# Patient Record
Sex: Male | Born: 1935 | Race: White | Hispanic: No | Marital: Married | Smoking: Former smoker
Health system: Southern US, Community
[De-identification: ages and names within clinical notes are randomized; demographics above are authoritative.]

## PROBLEM LIST (undated history)

## (undated) DIAGNOSIS — E785 Hyperlipidemia, unspecified: Secondary | ICD-10-CM

## (undated) DIAGNOSIS — R51 Headache: Secondary | ICD-10-CM

## (undated) DIAGNOSIS — C4490 Unspecified malignant neoplasm of skin, unspecified: Secondary | ICD-10-CM

## (undated) DIAGNOSIS — M7551 Bursitis of right shoulder: Secondary | ICD-10-CM

## (undated) DIAGNOSIS — Z9861 Coronary angioplasty status: Secondary | ICD-10-CM

## (undated) DIAGNOSIS — N200 Calculus of kidney: Secondary | ICD-10-CM

## (undated) DIAGNOSIS — R0609 Other forms of dyspnea: Secondary | ICD-10-CM

## (undated) DIAGNOSIS — M1990 Unspecified osteoarthritis, unspecified site: Secondary | ICD-10-CM

## (undated) DIAGNOSIS — I2510 Atherosclerotic heart disease of native coronary artery without angina pectoris: Secondary | ICD-10-CM

## (undated) DIAGNOSIS — N400 Enlarged prostate without lower urinary tract symptoms: Secondary | ICD-10-CM

## (undated) DIAGNOSIS — I152 Hypertension secondary to endocrine disorders: Secondary | ICD-10-CM

## (undated) DIAGNOSIS — R0600 Dyspnea, unspecified: Secondary | ICD-10-CM

## (undated) DIAGNOSIS — E1159 Type 2 diabetes mellitus with other circulatory complications: Secondary | ICD-10-CM

## (undated) DIAGNOSIS — K219 Gastro-esophageal reflux disease without esophagitis: Secondary | ICD-10-CM

## (undated) DIAGNOSIS — M109 Gout, unspecified: Secondary | ICD-10-CM

## (undated) DIAGNOSIS — I480 Paroxysmal atrial fibrillation: Secondary | ICD-10-CM

## (undated) DIAGNOSIS — I209 Angina pectoris, unspecified: Secondary | ICD-10-CM

## (undated) DIAGNOSIS — I10 Essential (primary) hypertension: Secondary | ICD-10-CM

## (undated) HISTORY — PX: INGUINAL HERNIA REPAIR: SUR1180

## (undated) HISTORY — PX: CORONARY ANGIOPLASTY WITH STENT PLACEMENT: SHX49

## (undated) HISTORY — PX: SKIN CANCER EXCISION: SHX779

---

## 1957-06-02 HISTORY — PX: APPENDECTOMY: SHX54

## 1979-02-01 HISTORY — PX: CHOLECYSTECTOMY: SHX55

## 1994-06-02 DIAGNOSIS — I2510 Atherosclerotic heart disease of native coronary artery without angina pectoris: Secondary | ICD-10-CM

## 1994-06-02 HISTORY — DX: Atherosclerotic heart disease of native coronary artery without angina pectoris: I25.10

## 1994-06-02 HISTORY — PX: CORONARY ARTERY BYPASS GRAFT: SHX141

## 1998-09-25 ENCOUNTER — Encounter: Admission: RE | Admit: 1998-09-25 | Discharge: 1998-12-17 | Payer: Self-pay | Admitting: Anesthesiology

## 1998-10-08 ENCOUNTER — Encounter: Admission: RE | Admit: 1998-10-08 | Discharge: 1999-01-06 | Payer: Self-pay | Admitting: Family Medicine

## 2000-04-10 ENCOUNTER — Encounter: Admission: RE | Admit: 2000-04-10 | Discharge: 2000-04-10 | Payer: Self-pay | Admitting: Cardiothoracic Surgery

## 2000-04-10 ENCOUNTER — Encounter: Payer: Self-pay | Admitting: Cardiothoracic Surgery

## 2001-03-26 ENCOUNTER — Encounter: Payer: Self-pay | Admitting: Cardiothoracic Surgery

## 2001-03-26 ENCOUNTER — Encounter: Admission: RE | Admit: 2001-03-26 | Discharge: 2001-03-26 | Payer: Self-pay | Admitting: Cardiothoracic Surgery

## 2001-05-05 ENCOUNTER — Ambulatory Visit (HOSPITAL_COMMUNITY): Admission: RE | Admit: 2001-05-05 | Discharge: 2001-05-05 | Payer: Self-pay | Admitting: Gastroenterology

## 2001-10-08 ENCOUNTER — Encounter: Payer: Self-pay | Admitting: Cardiothoracic Surgery

## 2001-10-08 ENCOUNTER — Encounter: Admission: RE | Admit: 2001-10-08 | Discharge: 2001-10-08 | Payer: Self-pay | Admitting: Cardiothoracic Surgery

## 2001-11-23 ENCOUNTER — Encounter: Payer: Self-pay | Admitting: Urology

## 2001-11-23 ENCOUNTER — Encounter: Admission: RE | Admit: 2001-11-23 | Discharge: 2001-11-23 | Payer: Self-pay | Admitting: Urology

## 2002-04-05 ENCOUNTER — Encounter: Admission: RE | Admit: 2002-04-05 | Discharge: 2002-05-12 | Payer: Self-pay | Admitting: Orthopedic Surgery

## 2002-04-15 ENCOUNTER — Encounter: Admission: RE | Admit: 2002-04-15 | Discharge: 2002-04-15 | Payer: Self-pay | Admitting: Cardiothoracic Surgery

## 2002-04-15 ENCOUNTER — Encounter: Payer: Self-pay | Admitting: Cardiothoracic Surgery

## 2002-09-15 ENCOUNTER — Ambulatory Visit (HOSPITAL_BASED_OUTPATIENT_CLINIC_OR_DEPARTMENT_OTHER): Admission: RE | Admit: 2002-09-15 | Discharge: 2002-09-15 | Payer: Self-pay | Admitting: Urology

## 2002-09-15 ENCOUNTER — Encounter: Payer: Self-pay | Admitting: Urology

## 2002-10-28 ENCOUNTER — Encounter: Payer: Self-pay | Admitting: Cardiothoracic Surgery

## 2002-10-28 ENCOUNTER — Encounter: Admission: RE | Admit: 2002-10-28 | Discharge: 2002-10-28 | Payer: Self-pay | Admitting: Cardiothoracic Surgery

## 2004-04-10 ENCOUNTER — Ambulatory Visit (HOSPITAL_COMMUNITY): Admission: RE | Admit: 2004-04-10 | Discharge: 2004-04-10 | Payer: Self-pay | Admitting: Orthopedic Surgery

## 2004-06-14 ENCOUNTER — Ambulatory Visit (HOSPITAL_COMMUNITY): Admission: RE | Admit: 2004-06-14 | Discharge: 2004-06-14 | Payer: Self-pay | Admitting: Orthopedic Surgery

## 2004-06-24 ENCOUNTER — Encounter: Admission: RE | Admit: 2004-06-24 | Discharge: 2004-07-11 | Payer: Self-pay | Admitting: Orthopedic Surgery

## 2004-07-01 ENCOUNTER — Encounter: Admission: RE | Admit: 2004-07-01 | Discharge: 2004-07-01 | Payer: Self-pay | Admitting: Otolaryngology

## 2005-05-22 ENCOUNTER — Ambulatory Visit (HOSPITAL_COMMUNITY): Admission: RE | Admit: 2005-05-22 | Discharge: 2005-05-22 | Payer: Self-pay | Admitting: Internal Medicine

## 2007-08-15 ENCOUNTER — Ambulatory Visit: Payer: Self-pay | Admitting: Internal Medicine

## 2007-08-15 ENCOUNTER — Inpatient Hospital Stay (HOSPITAL_COMMUNITY): Admission: EM | Admit: 2007-08-15 | Discharge: 2007-08-17 | Payer: Self-pay | Admitting: Emergency Medicine

## 2007-08-16 ENCOUNTER — Encounter (INDEPENDENT_AMBULATORY_CARE_PROVIDER_SITE_OTHER): Payer: Self-pay | Admitting: Internal Medicine

## 2008-10-30 ENCOUNTER — Inpatient Hospital Stay (HOSPITAL_COMMUNITY): Admission: EM | Admit: 2008-10-30 | Discharge: 2008-11-03 | Payer: Self-pay | Admitting: Emergency Medicine

## 2008-10-31 HISTORY — PX: CORONARY STENT INTERVENTION: CATH118234

## 2008-11-01 DIAGNOSIS — I2510 Atherosclerotic heart disease of native coronary artery without angina pectoris: Secondary | ICD-10-CM

## 2008-11-20 ENCOUNTER — Encounter: Admission: RE | Admit: 2008-11-20 | Discharge: 2008-11-20 | Payer: Self-pay | Admitting: Family Medicine

## 2010-03-04 ENCOUNTER — Encounter (HOSPITAL_COMMUNITY)
Admission: RE | Admit: 2010-03-04 | Discharge: 2010-06-02 | Payer: Self-pay | Source: Home / Self Care | Attending: Internal Medicine | Admitting: Internal Medicine

## 2010-06-03 ENCOUNTER — Encounter (HOSPITAL_COMMUNITY)
Admission: RE | Admit: 2010-06-03 | Discharge: 2010-07-02 | Payer: Self-pay | Source: Home / Self Care | Attending: Internal Medicine | Admitting: Internal Medicine

## 2010-06-17 LAB — GLUCOSE, CAPILLARY: Glucose-Capillary: 100 mg/dL — ABNORMAL HIGH (ref 70–99)

## 2010-08-12 LAB — GLUCOSE, CAPILLARY: Glucose-Capillary: 81 mg/dL (ref 70–99)

## 2010-09-09 LAB — GLUCOSE, CAPILLARY
Glucose-Capillary: 115 mg/dL — ABNORMAL HIGH (ref 70–99)
Glucose-Capillary: 122 mg/dL — ABNORMAL HIGH (ref 70–99)
Glucose-Capillary: 124 mg/dL — ABNORMAL HIGH (ref 70–99)
Glucose-Capillary: 128 mg/dL — ABNORMAL HIGH (ref 70–99)
Glucose-Capillary: 139 mg/dL — ABNORMAL HIGH (ref 70–99)
Glucose-Capillary: 153 mg/dL — ABNORMAL HIGH (ref 70–99)
Glucose-Capillary: 236 mg/dL — ABNORMAL HIGH (ref 70–99)
Glucose-Capillary: 99 mg/dL (ref 70–99)

## 2010-09-09 LAB — FOLATE: Folate: >20 ng/mL

## 2010-09-09 LAB — HEPARIN LEVEL (UNFRACTIONATED)
Heparin Unfractionated: 0.46 IU/mL (ref 0.30–0.70)
Heparin Unfractionated: 0.76 IU/mL — ABNORMAL HIGH (ref 0.30–0.70)
Heparin Unfractionated: 0.89 IU/mL — ABNORMAL HIGH (ref 0.30–0.70)

## 2010-09-09 LAB — VITAMIN B12: Vitamin B-12: >2000 pg/mL — ABNORMAL HIGH (ref 211–911)

## 2010-09-09 LAB — BASIC METABOLIC PANEL
BUN: 15 mg/dL (ref 6–23)
BUN: 20 mg/dL (ref 6–23)
CO2: 23 mEq/L (ref 19–32)
CO2: 23 mEq/L (ref 19–32)
Calcium: 9.1 mg/dL (ref 8.4–10.5)
Calcium: 9.2 mg/dL (ref 8.4–10.5)
Chloride: 107 mEq/L (ref 96–112)
Creatinine, Ser: 1.31 mg/dL (ref 0.4–1.5)
GFR calc Af Amer: >60 mL/min (ref >60)
Glucose, Bld: 131 mg/dL — ABNORMAL HIGH (ref 70–99)
Glucose, Bld: 135 mg/dL — ABNORMAL HIGH (ref 70–99)
Potassium: 4.1 mEq/L (ref 3.5–5.1)
Sodium: 139 mEq/L (ref 135–145)

## 2010-09-09 LAB — CARDIAC PANEL(CRET KIN+CKTOT+MB+TROPI)
CK, MB: 12.3 ng/mL — ABNORMAL HIGH (ref 0.3–4.0)
CK, MB: 17.5 ng/mL — ABNORMAL HIGH (ref 0.3–4.0)
CK, MB: 32.8 ng/mL — ABNORMAL HIGH (ref 0.3–4.0)
Relative Index: 3.9 — ABNORMAL HIGH (ref 0.0–2.5)
Relative Index: 8.1 — ABNORMAL HIGH (ref 0.0–2.5)
Total CK: 286 U/L — ABNORMAL HIGH (ref 7–232)
Troponin I: 2.45 ng/mL (ref 0.00–0.06)
Troponin I: 5.37 ng/mL (ref 0.00–0.06)

## 2010-09-09 LAB — LIPID PANEL
Cholesterol: 110 mg/dL (ref 0–200)
HDL: 43 mg/dL (ref >39)
LDL Cholesterol: 58 mg/dL (ref 0–99)
Total CHOL/HDL Ratio: 2.6 RATIO
Triglycerides: 43 mg/dL (ref <150)
VLDL: 9 mg/dL (ref 0–40)

## 2010-09-09 LAB — CBC
HCT: 36.2 % — ABNORMAL LOW (ref 39.0–52.0)
HCT: 39.1 % (ref 39.0–52.0)
Hemoglobin: 12.3 g/dL — ABNORMAL LOW (ref 13.0–17.0)
Hemoglobin: 13.1 g/dL (ref 13.0–17.0)
MCHC: 33.6 g/dL (ref 30.0–36.0)
MCHC: 33.7 g/dL (ref 30.0–36.0)
MCV: 96.8 fL (ref 78.0–100.0)
MCV: 97.1 fL (ref 78.0–100.0)
Platelets: 182 10*3/uL (ref 150–400)
RBC: 3.73 MIL/uL — ABNORMAL LOW (ref 4.22–5.81)
RBC: 3.76 MIL/uL — ABNORMAL LOW (ref 4.22–5.81)
RBC: 4.04 MIL/uL — ABNORMAL LOW (ref 4.22–5.81)
RDW: 14.4 % (ref 11.5–15.5)
RDW: 14.5 % (ref 11.5–15.5)
WBC: 5.7 10*3/uL (ref 4.0–10.5)

## 2010-09-09 LAB — IRON AND TIBC
Iron: 89 ug/dL (ref 42–135)
Saturation Ratios: 27 % (ref 20–55)
TIBC: 331 ug/dL (ref 215–435)
UIBC: 242 ug/dL

## 2010-09-09 LAB — TROPONIN I: Troponin I: 0.02 ng/mL (ref 0.00–0.06)

## 2010-09-09 LAB — RETICULOCYTES
RBC.: 4.05 MIL/uL — ABNORMAL LOW (ref 4.22–5.81)
Retic Count, Absolute: 48.6 10*3/uL (ref 19.0–186.0)
Retic Ct Pct: 1.2 % (ref 0.4–3.1)

## 2010-09-09 LAB — TSH: TSH: 0.997 u[IU]/mL (ref 0.350–4.500)

## 2010-09-09 LAB — HEMOGLOBIN A1C
Hgb A1c MFr Bld: 6 % (ref 4.6–6.1)
Mean Plasma Glucose: 126 mg/dL

## 2010-09-09 LAB — FERRITIN: Ferritin: 78 ng/mL (ref 22–322)

## 2010-09-09 LAB — CK TOTAL AND CKMB (NOT AT ARMC)
CK, MB: 1.2 ng/mL (ref 0.3–4.0)
Relative Index: 0.8 (ref 0.0–2.5)
Total CK: 158 U/L (ref 7–232)

## 2010-09-10 LAB — URINALYSIS, ROUTINE W REFLEX MICROSCOPIC
Nitrite: NEGATIVE
Specific Gravity, Urine: 1.012 (ref 1.005–1.030)
Urobilinogen, UA: 0.2 mg/dL (ref 0.0–1.0)

## 2010-09-10 LAB — DIFFERENTIAL
Basophils Absolute: 0 10*3/uL (ref 0.0–0.1)
Lymphocytes Relative: 24 % (ref 12–46)
Monocytes Absolute: 0.5 10*3/uL (ref 0.1–1.0)
Neutro Abs: 3.9 10*3/uL (ref 1.7–7.7)

## 2010-09-10 LAB — CBC
Hemoglobin: 12.9 g/dL — ABNORMAL LOW (ref 13.0–17.0)
RBC: 3.95 MIL/uL — ABNORMAL LOW (ref 4.22–5.81)
RDW: 14 % (ref 11.5–15.5)
WBC: 6.1 10*3/uL (ref 4.0–10.5)

## 2010-09-10 LAB — SODIUM, URINE, RANDOM: Sodium, Ur: 107 mEq/L

## 2010-09-10 LAB — GLUCOSE, CAPILLARY: Glucose-Capillary: 103 mg/dL — ABNORMAL HIGH (ref 70–99)

## 2010-09-10 LAB — BASIC METABOLIC PANEL
Calcium: 9.3 mg/dL (ref 8.4–10.5)
GFR calc Af Amer: 53 mL/min — ABNORMAL LOW (ref >60)
GFR calc non Af Amer: 44 mL/min — ABNORMAL LOW (ref >60)
Glucose, Bld: 126 mg/dL — ABNORMAL HIGH (ref 70–99)
Sodium: 138 mEq/L (ref 135–145)

## 2010-09-10 LAB — APTT: aPTT: 29 seconds (ref 24–37)

## 2010-09-10 LAB — PROTIME-INR: INR: 1.1 (ref 0.00–1.49)

## 2010-09-10 LAB — CREATININE, URINE, RANDOM: Creatinine, Urine: 61.9 mg/dL

## 2010-09-10 LAB — CK TOTAL AND CKMB (NOT AT ARMC): CK, MB: 1.5 ng/mL (ref 0.3–4.0)

## 2010-09-10 LAB — POCT CARDIAC MARKERS
CKMB, poc: <1 ng/mL — ABNORMAL LOW (ref 1.0–8.0)
Myoglobin, poc: 121 ng/mL (ref 12–200)
Myoglobin, poc: 142 ng/mL (ref 12–200)

## 2010-10-15 NOTE — Discharge Summary (Signed)
NAME:  Dale Haynes, Dale Haynes         ACCOUNT NO.:  [id number]   MEDICAL RECORD NO.:  [id number]          PATIENT TYPE:  INP   LOCATION:  3741                         FACILITY:  [facility]   PHYSICIAN:  Thereasa Solo. Little, M.D. DATE OF BIRTH:  [birth year]   DATE OF ADMISSION:  10/30/2008  DATE OF DISCHARGE:  11/03/2008                               DISCHARGE SUMMARY   DISCHARGE DIAGNOSES:  1. Unstable angina.  2. Progressive coronary artery disease status post cardiac      catheterization with intervention.  He had a non-drug-eluting stent      driver to his saphenous vein graft to right coronary artery to a      proximal stenosis 80%.  He had some distal thrombosis in his      posterior descending artery.  He was given intracoronary verapamil      and nitroglycerin with resolution in less than 5 minutes.  He had a      brisk TIMI III flow after procedure.  3. Postprocedure non-ST elevation myocardial infarction.  4. History of coronary artery bypass grafting in 1996 by Dr. Kathlee Nations      Trigt.  He had a left internal mammary artery (graft) to his left      anterior descending and saphenous vein graft to his right coronary      artery, saphenous vein graft to his diagonal, and saphenous vein      graft to his obtuse marginal.  5. Renal insufficiency with Cozaar and hydrochlorothiazide.  Celebrex      held on admission and prior to his catheterization.  6. Non-insulin-dependent diabetes mellitus.  7. Dyslipidemia.  8. Hypertension.  9. Remote paroxysmal atrial fibrillation.   LABORATORY DATA:  On November 02, 2008 sodium 139, potassium 4.1, chloride  106, CO2 23, glucose 135, BUN 15, creatinine 1.22, hemoglobin 12.3,  hematocrit 36.2, WBC 6.4 and platelets 158.  Hemoglobin A1c was 6.0, TSH  was 0.997.  Total cholesterol was 110, triglycerides 43, HDL 43 and LDL  58.  Magnesium was 1.8.  On admission, point of care marker x2 is  negative.  CK-MB  1. 151/1.5, troponin of 0.01.  2. 158/1.2,  troponin of 0.02.  3. 155/1.4 troponin of 0.02.   Post procedure  1. CPK-MB 286/17.5, troponin of 2.45.  2. 403/32, troponin of 5.26.  3. 371/18.1 troponin of 5.37.  4. 315/12.3 troponin of 6.05.   X-ray Oct 30, 2008 showed no active process was evident.   DISCHARGE MEDICATIONS:  1. Actos 45 mg daily.  2. Allopurinol 200 mg a day.  3. Aspirin 81 mg a day.  4. Cozaar 25 mg a day.  5. Lipitor 10 mg a day.  6. Nexium 40 mg everyday.  7. Niaspan 2000 mg a day.  8. Proscar 5 mg a day.  9. Zetia 10 mg a day.  10.Januvia 5 mg everyday.  11.B12 500 mg a day.  12.Multivitamin everyday.  13.Lanoxin 0.25 mg a day.  14.Glucotrol half of a 2.5 mg everyday.  15.Vitamin D 4000 units everyday.  16.He is to start carvedilol 3.125 mg twice per day.  17.He is to start  his Plavix 75 mg a day for 90 days.  18.Nitroglycerin 1/150 under tongue every 5 minutes x3 when needed for      chest pain.   HOSPITAL COURSE:  [name] is a 75 year old male patient of Dr.  Julieanne Manson.  His primary care doctor is Dr. Charlesetta Shanks.  He came  into the hospital with left-sided numbness, left jaw pain relieved with  nitroglycerin, no exertional symptoms and no shortness of breath.  He  had some mild nausea with this.  He came to the emergency room was  admitted by [facility].  We were consulted.  He had some mild  renal insufficiency.  His Celebrex, Cozaar and hydrochlorothiazide were  held.  He was seen by Dr. Clarene Duke the following day and it was decided  that he should undergo cardiac catheterization.  This was performed by  Dr. Clarene Duke on November 01, 2008.  He was found to have a high-grade lesion in  his SVG to his RCA.  His native RCA was 100% mid.  He went on to have a  non DES stent placed to SVG to his RCA graft.  He had some distal  thrombosis and he was given intracoronary verapamil and nitroglycerin  for this.  He had brisk TIMI III flow.  His enzymes did bump, thus he  was kept an  additional day.  He ambulated in the halls.  He was seen by  cardiac rehab.  He had no chest pain symptoms and again he was seen by  Dr. Clarene Duke on November 03, 2008, considered stable for discharge home.  He  wanted him to have limited activity for 2 weeks.  He wants him to be in  Plavix 75 mg for 90 days.  He will see Dr. Clarene Duke back in 10 days.  Dr.  Clarene Duke had a long discussion about his procedure and his activity  restrictions.  He does have an appointment with Dr. Clarene Duke on November 13, 2008 at 2 p.m.      Lezlie Octave, N.P.    ______________________________  Thereasa Solo. Little, M.D.    BB/MEDQ  D:  11/03/2008  T:  11/04/2008  Job:  [id number]   cc:   Charlesetta Shanks

## 2010-10-15 NOTE — H&P (Signed)
NAME:  Dale Haynes, Dale Haynes NO.:  [id number]   MEDICAL RECORD NO.:  [id number]          PATIENT TYPE:  EMS   LOCATION:  MAJO                         FACILITY:  [facility]   PHYSICIAN:  Dale Lav, MD  DATE OF BIRTH:  01-Oct-1935   DATE OF ADMISSION:  10/30/2008  DATE OF DISCHARGE:                              HISTORY & PHYSICAL   PRIMARY CARE PHYSICIAN:  Dr. Charlesetta Shanks.   CARDIOLOGIST:  Dr. Julieanne Manson.   CHIEF COMPLAINT:  Chest tightness.   HISTORY OF PRESENT ILLNESS:  Dale Haynes is a 75 year old Caucasian  gentleman with past medical significant for hypertension,  hyperlipidemia, diabetes mellitus, known coronary artery disease status  post four-vessel bypass surgery in 1996 who has had been having  increasing episodes of chest tightness with exertion over the last  several months.  Typically they come on when he is exerting himself, for  example doing elliptical trainer which he does at times up to 20 minutes  a day four times a week.  Can also have when he is working on his  stationary bicycle.  This weekend, Saturday, when he was putting in  cabinets with his wife, he got the end of this task and then developed  chest tightness which was accompanied by some dyspnea and nausea.  This  lasted for a few minutes and got better.  In the past, his chest  tightness has been relieved with stopping his physical exertion.  This  morning at 5:30 he was awakened from sleep with chest tightness, left-  sided, rated as being 7/10 in severity radiating to his left jaw  accompanied by nausea, diaphoresis, dyspnea and numbness going down his  left arm which greatly disturbed him.  He took three full-dose aspirins  at home and came to the emergency department.  His chest tightness  persisted on arrival emergency department and responded to 2 sublingual  nitroglycerin.  He is currently without any chest pain or tightness.  He  does have some numbness in his arm, feels  like someone is applying a  blood pressure cuff to his arm.  He had not alerted his primary care  physician or his cardiologist of these symptoms and so they are  relatively new.   PAST MEDICAL HISTORY:  1. Coronary artery disease status post coronary bypass grafting in      1996.  2. History atrial fibrillation.  The patient not anticoagulated      currently in sinus rhythm.  3. Hyperlipidemia.  4. Diabetes mellitus.  5. Hypertension.  6. History of syncope in March 2009 when he was admitted to the      teaching service.  This was thought to be due to volume depletion      and orthostatic hypotension.  He had his hydrochlorothiazide and      Imdur stopped and held during that hospitalization.  7. BPH.  8. Gastroesophageal reflux disease.  9. Gout.   PAST SURGICAL HISTORY:  1. Repair of labrum in the shoulder.  2. Bypass surgery as described above.   FAMILY HISTORY:  Has had 3 relatives die due to complications associate  coronary disease.   SOCIAL HISTORY:  He used to smoke a pipe tobacco but quit in 1994.  He  rarely drinks alcohol.  No other recreational drug use.  He is retired  from [employer], having served 26 years.  Married and accompanied by his  wife.   REVIEW OF SYSTEMS:  Described above in history present illness,  otherwise 10-point review of systems is negative.   CURRENT MEDICATIONS:  1. Glucotrol 2.5 mg in the morning.  2. Hydrochlorothiazide 12.5 mg in morning.  3. Digoxin 0.25 mg in the morning.  4. Vitamin B12 500 mg the morning.  5. Celebrex 200 mg twice daily.  6. Actos 45 mg at bedtime.  7. Allopurinol 200 mg at bedtime.  8. Aspirin 81 mg at bedtime.  9. Cozaar 25 mg in the evening.  10.Lipitor 10 mg in the evening.  11.Nexium 40 mg in the evening.  12.Proscar 5 mg in the evening.  13.Zetia 10 mg the evening.  14.Niaspan 2000 mg in the evening.   ALLERGIES:  No known drug allergies.   PHYSICAL EXAMINATION:  VITAL SIGNS:  Blood pressure 134/67,  pulse 67,  respirations 18, pulse ox 98% on 2 liters via nasal cannula.  GENERAL:  Quite pleasant gentleman in no acute distress.  Slightly hard  of hearing.  HEENT:  Normocephalic, atraumatic.  Pupils equal, round, and reactive to  light.  Sclerae icteric.  Oropharynx clear.  CARDIOVASCULAR:  Regular rate and rhythm.  No murmurs, gallops rubs  heard.  LUNGS:  Clear auscultation bilaterally without wheeze, rales, or  rhonchi.  ABDOMEN:  Soft, nondistended, nontender.  EXTREMITIES:  With 1+ pretibial edema.  NEUROLOGIC:  Nonfocal.  The patient is alert and oriented x4.   LABORATORY DATA:  1. EKG shows sinus rhythm with 57 beats per minute, some T-wave      version in V1, signs of old T in V3.  These are unchanged compared      to prior EKG.  He also some T-wave inversion in the AVF and III.      These changes are not new in comparison to EKG from August 16, 2007.  2. A chest x-ray with no acute findings.  3. Cardiac markers negative.  4. Magnesium 1.8.  5. BMP sodium 138, potassium 4.7, chloride 104, bicarb 26, BUN and      creatinine 30 and 1.58, glucose 126, calcium 9.3, PTT 28.  PT 14.3.      CBC differential white count 6.1 hemoglobin 12.9, hematocrit 38,      platelets 195,000.   ASSESSMENT/PLAN:  This is a 75 year old Caucasian gentleman known  coronary artery disease status post artery bypass grafting with acute  onset of chest tightness radiating to his jaw with numbness, nausea,  diaphoresis, classic for angina.  This occurred at rest when it awakened  in from sleep this morning.  1  Acute coronary syndrome:  I am going to put him on full dose heparin.  I am going to continue on his Lipitor.  I will continue him on aspirin,  although he has had 3 aspirin today.  I will give morphine as needed for  pain.  I will hold his hydrochlorothiazide for the moment due to the  fact that he has a little bit of renal insufficiency.  I am not sure if  this is worsening acutely.  I will  reinstitute his Cozaar as his blood  pressure improves.  I will consider adding a beta blocker, but his  pulse  is a bit slow.I will continue on his digoxin. I Will cycle his cardiac  enzymes.  I have talked to Dr. Garen Lah from [facility] to  see the patient later today.  1. Atrial fibrillation.  The patient currently in sinus rhythm.  Will      get a 2-D echocardiogram in the morning.  I will continue on      digoxin for the time being.  2. Diabetes mellitus.  I am going to hold his oral agents for time      being, put on sliding scale insulin as I am anticipating keeping      n.p.o. after midnight at least and may have other studies.  I will      put him a diabetic, heart-healthy diet..  I would consider put him      on an ACE inhibitor for his blood pressure, provided we watch his      renal function closely.  3. Gout.  Will continue on allopurinol.  4. Gastroesophageal reflux disease.  I will continue on Nexium6.      Benign prostatic hypertrophy.  Continue Proscar.  5. Hyperlipidemic.  Continue his Lipitor, Zetia and Niaspan.  6. Osteoarthritis.  Hold Celebrex given worsening renal function at      this point in time.  7. Renal insufficiency.  Check urine electrolytes, give bolus of      normal saline 500, mL.  8. Prophylaxis.  The patient is fully anticoagulated.  9. Code Status.  The patient is Full Code, but would not want to be      maintained on a ventilator or given tube feeds, or have his life      unnecessarily prolonged.  In particular, if he was in a vegetative      state.  He has a living will and his wife is healthcare power of      attorney.      Dale Lav, MD  Electronically Signed     CV/MEDQ  D:  10/30/2008  T:  10/30/2008  Job:  [id number]   cc:   Thereasa Solo. Little, M.D.  Sheliah Mends, MD

## 2010-10-15 NOTE — Cardiovascular Report (Signed)
NAME:  Dale Haynes, GRABE NO.:  [id number]   MEDICAL RECORD NO.:  [id number]          PATIENT TYPE:  INP   LOCATION:  2501                         FACILITY:  [facility]   PHYSICIAN:  Thereasa Solo. Little, M.D. DATE OF BIRTH:  [birth year]   DATE OF PROCEDURE:  11/01/2008  DATE OF DISCHARGE:                            CARDIAC CATHETERIZATION   INDICATION FOR TEST:  This 75 year old male had bypass surgery in 1996.  He was admitted with unstable angina.  His cardiac markers and EKG are  all unremarkable.   After obtaining informed consent, the patient was prepped and draped in  the usual sterile fashion exposing the right groin.  Following local  anesthetic with 1% Xylocaine, the Seldinger technique was employed and a  5-French introducer sheath was placed in the right femoral artery.  Left  and right coronary arteriography, graft visualization x4, PCI to the  saphenous vein graft to the RCA and a ventriculogram was performed.   MEDICATIONS:  Plavix 600 mg p.o., Angiomax IV, Versed 1 mg IV, fentanyl  25 mg IV, intracoronary nitro x2 and verapamil 200 mcg intracoronary.   RESULTS:  1. Hemodynamic monitoring:  Central aortic pressure was 130/66.  Left      ventricular pressure 130/9.  There was no gradient at the time of      pullback.  2. Ventriculography.  Ventriculography was performed during this      procedure using 20 mL of contrast at 12 mL per second revealed      marked ventricular ectopy but normal systolic function with no wall      motion abnormalities.  The left ventricular end-diastolic pressure      was 15.  3. Coronary arteriography:  Calcification was seen on fluoroscopy in      the distribution of the left main and LAD.      a.     Left main normal, it bifurcated.      b.     Circumflex.  The second OM was totally occluded as it came       off the ongoing circ.  The first OM and the circumflex itself were       free of disease.      c.     LAD.  There was  bidirectional flow in the mid-LAD.  At the       mid LAD and second diagonal was a 60% area of haziness.  The       distal LAD was grafted.      d.     Right coronary artery 100% occluded in its midportion.  4. Grafts:      a.     Saphenous vein graft to the diagonal.  The graft was greater       than 4 mm in diameter.  It was widely patent.  The diagonal was       widely patent.  There was reflux of a contrast media from the       diagonal into the proximal LAD, left main and even the circumflex       system.  b.     Saphenous vein graft to the OM.  The graft was widely patent       as was the OM.      c.     Saphenous vein graft to the RCA.  There was a proximal       eccentric 80% area of narrowing in the saphenous vein graft.  The       graft was greater than 4 mm and the remainder of the graft was       nice and smooth.  The PDA and posterolateral vessels were all free       of disease.      d.     Internal mammary artery to the LAD.  The internal mammary       artery was widely patent.  LAD was widely patent.   At this point, the sheath was upgraded to a 6-French sheath system.  The  patient was given IV Angiomax and ACT of 357 was obtained.  With this a  6-French right coronary bypass guide catheter was used and a short Luge  wire.  The wire was placed into the distal right system.  A 4.0 x 18  driver stent was placed in the area of proximal obstruction.  My first  injection before the stent was employed showed what appeared to be a  linear dissection within this proximal segment.  Making sure that both  the proximal and distal areas of this dissected area were well covered  with the stent, it was initially deployed at 17 atmospheres for 50  seconds with a final inflation being 16 atmospheres for 30 seconds.   It was then postdilated with a 4.5 x 15 [place] apex balloon using 14  atmospheres for 40 seconds.  The area that had been 80% narrowed was now  widely patent and there  was no residual evidence of a dissection.  There  was however, diminished blood flow in the distal graft and in the PDA.  The patient was given intracoronary nitroglycerin, 200 mcg of  intracoronary verapamil and final 100 mg of intracoronary nitroglycerin.  Within less than 5 minutes the diminished flow phenomenon after the  intervention was completely resolved and there was brisk TIMI III flow  down the graft and into all the vessels.  There was no evidence of any  loss of any of the terminal branches of the vessels.   I plan to check his cardiac markers and an EKG tomorrow.  He may very  well be ready for discharge in the next 24-48 hours.  A non drug-eluting  stent was used but he will certainly be going on Plavix for a minimum of  60 days.   Of note, I used 190 mL of contrast.           ______________________________  Thereasa Solo. Little, M.D.     ABL/MEDQ  D:  11/01/2008  T:  11/01/2008  Job:  [id number]   cc:   Fleet Contras, M.D.  Cath Lab

## 2010-10-18 NOTE — Discharge Summary (Signed)
NAMEMarland Kitchen  Dale, Haynes NO.:  [id number]   MEDICAL RECORD NO.:  [id number]          PATIENT TYPE:  INP   LOCATION:  2040                         FACILITY:  [facility]   PHYSICIAN:  Dale Haynes, M.D.  DATE OF BIRTH:  [birth year]   DATE OF ADMISSION:  08/15/2007  DATE OF DISCHARGE:  08/17/2007                               DISCHARGE SUMMARY   PRIMARY CARE PHYSICIAN:  Dale Shanks, MD.   CARDIOLOGIST:  Dale Solo. Little, MD.   DISCHARGE DIAGNOSES:  1. Syncope.  2. Hypertension.  3. Diabetes mellitus.  4. Hyperlipidemia.  5. Atrial fibrillation.  6. Coronary artery disease, status post coronary artery bypass graft      in 1996.  7. Benign prostatic hypertrophy.  8. Gastroesophageal reflux disease.  9. Gout.   DISCHARGE MEDICATIONS:  1. Glucotrol 5 mg 1 tablet by mouth daily.  2. Byetta 5 mcg twice a day, injected subcutaneously.  3. Actos 45 mg 1 tablet by mouth daily.  4. Allopurinol 200 mg 1 tablet by mouth daily.  5. Aspirin 81 mg 1 tablet by mouth daily.  6. Cozaar 25 mg 1 tablet by mouth daily.  7. Lipitor 10 mg 1 tablet by mouth daily.  8. Zetia 10 mg 1 tablet by mouth daily.  9. Niacin 2000 mg 1 tablet by mouth daily.  10.Digoxin 0.25 mg 1 tablet by mouth daily.  11.Vitamin B12 with pyridoxine 1 tablet by mouth daily, equals to 1000      mcg.  12.Proscar 5 mg daily by mouth.   DISPOSITION AND FOLLOWUP:  The patient was discharged home in a stable  condition with a followup appointment with his primary care physician,  Dale Haynes, on August 31, 2007.  He needs to have a blood pressure blood  pressure check with medication adjustment as needed. He will also need  to be assessed for lower extremity edema.  He will also need to followup  with his urologist for medication adjustment as his current medications  were causing orthostatic hypotension. The patient will also have a  followup appointment with his cardiologist Dale Haynes on September 13, 2007, at 2:00 p.m. He has an appointment for carotid dopplers on  August 30, 2007, at 2:00 p.m.   PROCEDURES PERFORMED DURING THIS ADMISSION:  CT of the head without  contrast was negative for bleed  Chest x-ray demonstrated a previous CABG with the heart size in upper  limits normal, mild bibasilar interstitial prominence without confluent  infiltrate and no definite effusion.   CONSULTATIONS:  Cardiology, Dale Haynes, was consulted during this  admission.   HISTORY OF PRESENT ILLNESS:  Dale Haynes is a 75 year old male with past  medical history of hypertension, hyperlipidemia, diabetes mellitus, and  coronary artery disease status post CABG in 1996 who came to the  emergency department after experiencing an event of syncope on the  morning of admission.  The patient reports that he was feeling dizzy and  nauseated before passing out.  His wife says that the whole event lasted  around 5 minutes. She states that he was unconscious for about 1 minute  then began talking to her though the patient does not recall this. When  the EMS arrived, the patient's orthostatic vital signs were 90 over  palpable lying down and 70 over palpable standing.  The patient denies  vomiting, abdominal pain, chest pain, palpitation, diaphoresis,  shortness of breath, or dyspnea on exertion He states that this is the  first time that he passed out, but he has experienced dizziness and  lightheadedness with standing.   PHYSICAL EXAM:  Temperature 97, blood pressure 119/64, heart rate 53,  respiratory rate 16, and oxygen saturation 99% on room air.  GENERAL:  The patient was sitting on the edge of the bed, calm, and in  no acute distress.  HEENT:  EYES:  Pupils equal, rounded, and reactive to light and  accommodation.  Extraocular muscles intact.  No icterus.  There was poor  dentition, but moist oral mucous membrane.  No erythema.  No exudates.  NECK:  Supple without thyromegaly and no bruits.   RESPIRATORY:  Clear to auscultation bilaterally with excellent air  movement.  CARDIOVASCULAR:  The patient was bradycardic with a heart rate of 53,  but regular rhythm without murmur, gallops, or rubs.  No chest pain  during palpation.  GASTROINTESTINAL:  Soft, nontender, and nondistended with positive bowel  sounds.  EXTREMITIES:  No edema or cyanosis.  Good pulses bilaterally.  No  apparent lesions.  SKIN:  Moist without rash.  No lymphadenopathy.  NEURO:  The patient was alert, awake, and oriented x3 with intact  cranial nerve II through XII.  Normal strength 5/5 in all limbs with  normal range of motion.  The patient presented appropriate affect during  the whole examination.  Finger-to-nose and heel-to-shin was completely  normal.   LABORATORY DATA:  Sodium 137, potassium 4.3, chloride 103, bicarb 24,  BUN 20, creatinine 1.5, and glucose 133.  White blood cells 7.6,  hemoglobin 13.2, platelets 219, ANC 5.9, MCV 97.1.  BNP 53.  Anion gap  10, ionized calcium 1.5, total bilirubin of 0.5, alkaline phosphatase  47, AST 26, ALT 11, total protein 6.2, albumin 3.5, and ionized calcium  9.2.  Urinalysis was completely negative.  D-dimer 0.25.   HOSPITAL COURSE BY PROBLEMS:  1. Syncope.  Most likely secondary to orthostatic hypotension      secondary to his medications.  Cardiac enzymes and troponins were      negative and the patient had no abnormalities on EKG or telemetry.      2D ECHO was normal. Head CT was negative for acute change. Blood      pressure medications and BPH medication were held. The patient had      no further symptoms. It was decided to stop his HCTZ and Imdur and      his other antihypertensives were restarted.  Proscar was restarted      and his Uroxatral was held. The patient will follow up with his      PCP, his urologist and with Dale Haynes for further medication      adjustment.   1. Diabetes.  The patient had a hemoglobin A1c of 5.7.  His home       medications were continued.   1. Hyperlipidemia.  The patient is going to continue using the      Lipitor, Zetia, and niacin.  His lipid profile was completely      normal with a total cholesterol of 101, triglycerides 63, HDL 42,      and LDL  26.   1. Hypertension.  The patient was discharged home just using Cozaar      for his blood pressure as noted above.   1. Atrial fibrillation.  The patient was in sinus rhythm during      admission.  He was continued on digoxin and aspirin daily.   1. BPH.  As noted above, Uroxatral was stopped and Proscar was      restarted.  The pt instructed to follow up with his urologist.  His      appointment is going to be arranged by his primary care physician,   At discharge, his vital signs were temperature 97.5, pulse 58,  respirations 20, systolic blood pressure 122, diastolic blood pressure  78, and oxygen saturation 95% on room air.  His labs demonstrated a  sodium of 140, potassium 4.9, chloride 109, bicarb 23, glucose 114, BUN  21, and creatinine 1.41.  CBC:  White blood cells 7.1, hemoglobin 12.7,  hematocrit 37.7, MCV 97.0, and platelets 212.      Rosanna Randy, MD  Electronically Signed      Dale Haynes, M.D.  Electronically Signed    CEM/MEDQ  D:  08/22/2007  T:  08/23/2007  Job:  [id number]   cc:   Debria Garret. Haynes, M.D.

## 2010-10-18 NOTE — Op Note (Signed)
NAME:  Dale Haynes, DAYRIT NO.:  [id number]   MEDICAL RECORD NO.:  [id number]          PATIENT TYPE:  AMB   LOCATION:  DAY                          FACILITY:  [facility]   PHYSICIAN:  Ollen Gross, M.D.    DATE OF BIRTH:  [birth year]   DATE OF PROCEDURE:  06/14/2004  DATE OF DISCHARGE:                                 OPERATIVE REPORT   PREOPERATIVE DIAGNOSES:  Left shoulder labral tear, impingement syndrome,  acromioclavicular joint arthrosis.   POSTOPERATIVE DIAGNOSES:  Left shoulder labral tear, impingement syndrome,  acromioclavicular joint arthrosis.   PROCEDURE:  Left shoulder arthroscopy with subacromial decompression and  distal clavicle resection, labral debridement, and biceps tenotomy.   SURGEON:  Ollen Gross, M.D.   ASSISTANT:  Alexzandrew L. Julien Girt, P.A.   ANESTHESIA:  General with interscalene block.   ESTIMATED BLOOD LOSS:  Minimal.   DRAIN:  None.   COMPLICATIONS:  None.   CONDITION:  Stable to recovery.   BRIEF CLINICAL NOTE:  Mr. Holzman is a 75 year old male who has had a  long history of progressive worsening left shoulder pain.  He has had  injections which have only helped temporarily.  His MRI scan recently showed  a normal-appearing rotator cuff but with labral tear as well as impingement  morphology.  He presents now for the above-mentioned procedure.   PROCEDURE IN DETAIL:  After the successful administration of interscalene  block and general anesthetic, the patient is placed in the upright beach-  chair positioner.  His left upper extremity and shoulder girdle are isolated  from his trunk with plastic drapes and prepped and draped in the usual  sterile fashion.  Arthroscopic landmarks are marked and an incision made for  the posterior portal.  Camera and cannula passed into the joint.  Once  confirmed to be intra-articular, inflow is initiated.  He does have evidence  of a tear in the biceps tendon as well as in the superior  and posterior  labrum.  The surfaces of the glenoid and humeral head do not show any  significant degenerative change.  The undersurface of the supraspinatus and  infraspinatus also looked normal.  The biceps tendon does appear to be in a  slightly subluxed position in the joint.  A spinal needle is used to  localize the anterior portal.  A small incision is made and a Concepts  cannula passed into the joint.  The ArthroCare device is placed, and the  labrum is debrided back to a stable base with the ArthroCare.  This is then  probed and found to be stable.  The biceps is very unstable, and it is  subluxing in and out of the joint.  I subsequently performed a biceps  tenotomy removing it from its base on the superior glenoid, and we are able  to visualize the tendon exiting the joint and going into the bicipital  tuberosity.  The stump is then debrided with the ArthroCare.  The rest of  the glenohumeral joint is fine.  We then exited the glenohumeral joint and  entered the subacromial space.  He had a significant amount of hypertrophic,  inflamed tissue in the subacromial space.  A soft tissue decompression is  performed with the ArthroCare device through the anterior portal and  subsequently, the lateral portal is created to complete the soft tissue  decompression.  A bony acromioplasty is performed with a bur through the  lateral portal to create the flat undersurface of the acromion.  I then put  the bur through the anterior portal to resect about 5 mm to 8 mm off the  distal clavicle and remove any spurs from underneath the clavicle.  Once  this is completed, any arthroscopic equipment is removed and the portals  closed with interrupted 4-0 nylon.  A bulky dressing is applied, and then he  is placed into a shoulder sling, awakened, and transported to recovery in  stable condition.     Drenda Freeze   FA/MEDQ  D:  06/14/2004  T:  06/14/2004  Job:  [id number]

## 2010-10-18 NOTE — Procedures (Signed)
[place]. [facility]  Patient:    Dale, Haynes Visit Number: [id number] MRN: [id number]          Service Type: END Location: ENDO Attending Physician:  Charna Elizabeth Dictated by:   Anselmo Rod, M.D. Proc. Date: 05/05/01 Admit Date:  05/05/2001                             Procedure Report  DATE OF BIRTH:  May 09, 1936.  REFERRING PHYSICIAN:  Velna Hatchet, M.D.  PROCEDURE PERFORMED:  Colonoscopy.  ENDOSCOPIST:  Anselmo Rod, M.D.  INSTRUMENT USED:  Olympus video colonoscope.  INDICATIONS FOR PROCEDURE:  Rectal bleeding in a 75 year old white male rule out colonic polyps, masses, hemorrhoids, etc.  PREPROCEDURE PREPARATION:  Informed consent was procured from the patient. The patient was fasted for eight hours prior to the procedure and prepped with a bottle of magnesium citrate and a gallon of NuLytely the night prior to the procedure.  PREPROCEDURE PHYSICAL:  The patient had stable vital signs.  Neck supple. Chest clear to auscultation.  S1, S2 regular.  Abdomen soft with normal bowel sounds.  DESCRIPTION OF PROCEDURE:  The patient was placed in the left lateral decubitus position and sedated with 70 mg of Demerol and 7 mg of Versed intravenously.  Once the patient was adequately sedated and maintained on low-flow oxygen and continuous cardiac monitoring, the Olympus video colonoscope was advanced from the rectum to the cecum without difficulty.  The patient had extensive left-sided diverticulosis.  No masses or polyps were seen.  Small internal hemorrhoids were appreciated on retroflexion in the rectum.  The patient tolerated the procedure well without complications.  IMPRESSION: 1. Left-sided diverticulosis. 2. Small nonbleeding internal hemorrhoid. 3. No masses or polyps seen.  RECOMMENDATIONS: 1. A high fiber diet has been discussed with the patient and brochures on    diverticulosis have been handed to him for his  education. 2. Repeat colorectal cancer screening is recommended in the next 10 years    unless the patient were to develop any abnormal symptoms in the interim. 3. Outpatient follow-up is advised on a p.r.n. basis. Dictated by:   Anselmo Rod, M.D. Attending Physician:  Charna Elizabeth DD:  05/05/01 TD:  05/05/01 Job: [id number]

## 2011-02-24 LAB — BASIC METABOLIC PANEL
BUN: 21
CO2: 23
CO2: 24
Calcium: 9
Calcium: 9
Calcium: 9.5
Chloride: 103
Chloride: 104
Creatinine, Ser: 1.37
Creatinine, Ser: 1.41
GFR calc Af Amer: 57 — ABNORMAL LOW
GFR calc non Af Amer: 50 — ABNORMAL LOW
Glucose, Bld: 100 — ABNORMAL HIGH
Glucose, Bld: 114 — ABNORMAL HIGH
Potassium: 4.9
Sodium: 137
Sodium: 139

## 2011-02-24 LAB — CBC
HCT: 37.7 — ABNORMAL LOW
Hemoglobin: 11.9 — ABNORMAL LOW
Hemoglobin: 13.2
MCHC: 33
MCHC: 33.8
Platelets: 212
RBC: 3.66 — ABNORMAL LOW
RBC: 4.11 — ABNORMAL LOW
RDW: 15.4
WBC: 7.1
WBC: 7.6
WBC: 9.3

## 2011-02-24 LAB — COMPREHENSIVE METABOLIC PANEL
AST: 26
Albumin: 3.5
BUN: 28 — ABNORMAL HIGH
Calcium: 9.2
Creatinine, Ser: 1.56 — ABNORMAL HIGH
GFR calc Af Amer: 54 — ABNORMAL LOW
Total Protein: 6.2

## 2011-02-24 LAB — POCT CARDIAC MARKERS
Myoglobin, poc: 86.2
Operator id: 196461
Operator id: 257131

## 2011-02-24 LAB — LIPID PANEL
Cholesterol: 101
HDL: 42
Triglycerides: 63

## 2011-02-24 LAB — CK TOTAL AND CKMB (NOT AT ARMC)
CK, MB: 1
Relative Index: 0.8
Total CK: 119

## 2011-02-24 LAB — HEMOGLOBIN A1C
Hgb A1c MFr Bld: 5.7
Mean Plasma Glucose: 126

## 2011-02-24 LAB — DIFFERENTIAL
Basophils Relative: 1
Lymphs Abs: 1.1
Monocytes Absolute: 0.4
Monocytes Relative: 6
Neutro Abs: 5.9

## 2011-02-24 LAB — TSH: TSH: 0.974

## 2011-02-24 LAB — CARDIAC PANEL(CRET KIN+CKTOT+MB+TROPI)
CK, MB: 0.9
CK, MB: 1
Relative Index: 0.9
Total CK: 110

## 2011-02-24 LAB — URINALYSIS, ROUTINE W REFLEX MICROSCOPIC
Bilirubin Urine: NEGATIVE
Hgb urine dipstick: NEGATIVE
Protein, ur: 30 — AB
Urobilinogen, UA: 0.2

## 2011-02-24 LAB — TROPONIN I: Troponin I: <0.01

## 2011-02-24 LAB — URINE MICROSCOPIC-ADD ON

## 2011-02-24 LAB — B-NATRIURETIC PEPTIDE (CONVERTED LAB): Pro B Natriuretic peptide (BNP): 53

## 2011-02-24 LAB — MAGNESIUM: Magnesium: 1.6

## 2011-03-19 IMAGING — CR DG CHEST 2V
2 series · 2 of 2 positions shown · non-contrast
Comparison: 08/15/2007

CLINICAL DATA: Left arm numbness.  Chest tightness.

CHEST - 2 VIEW

[w chest pa]
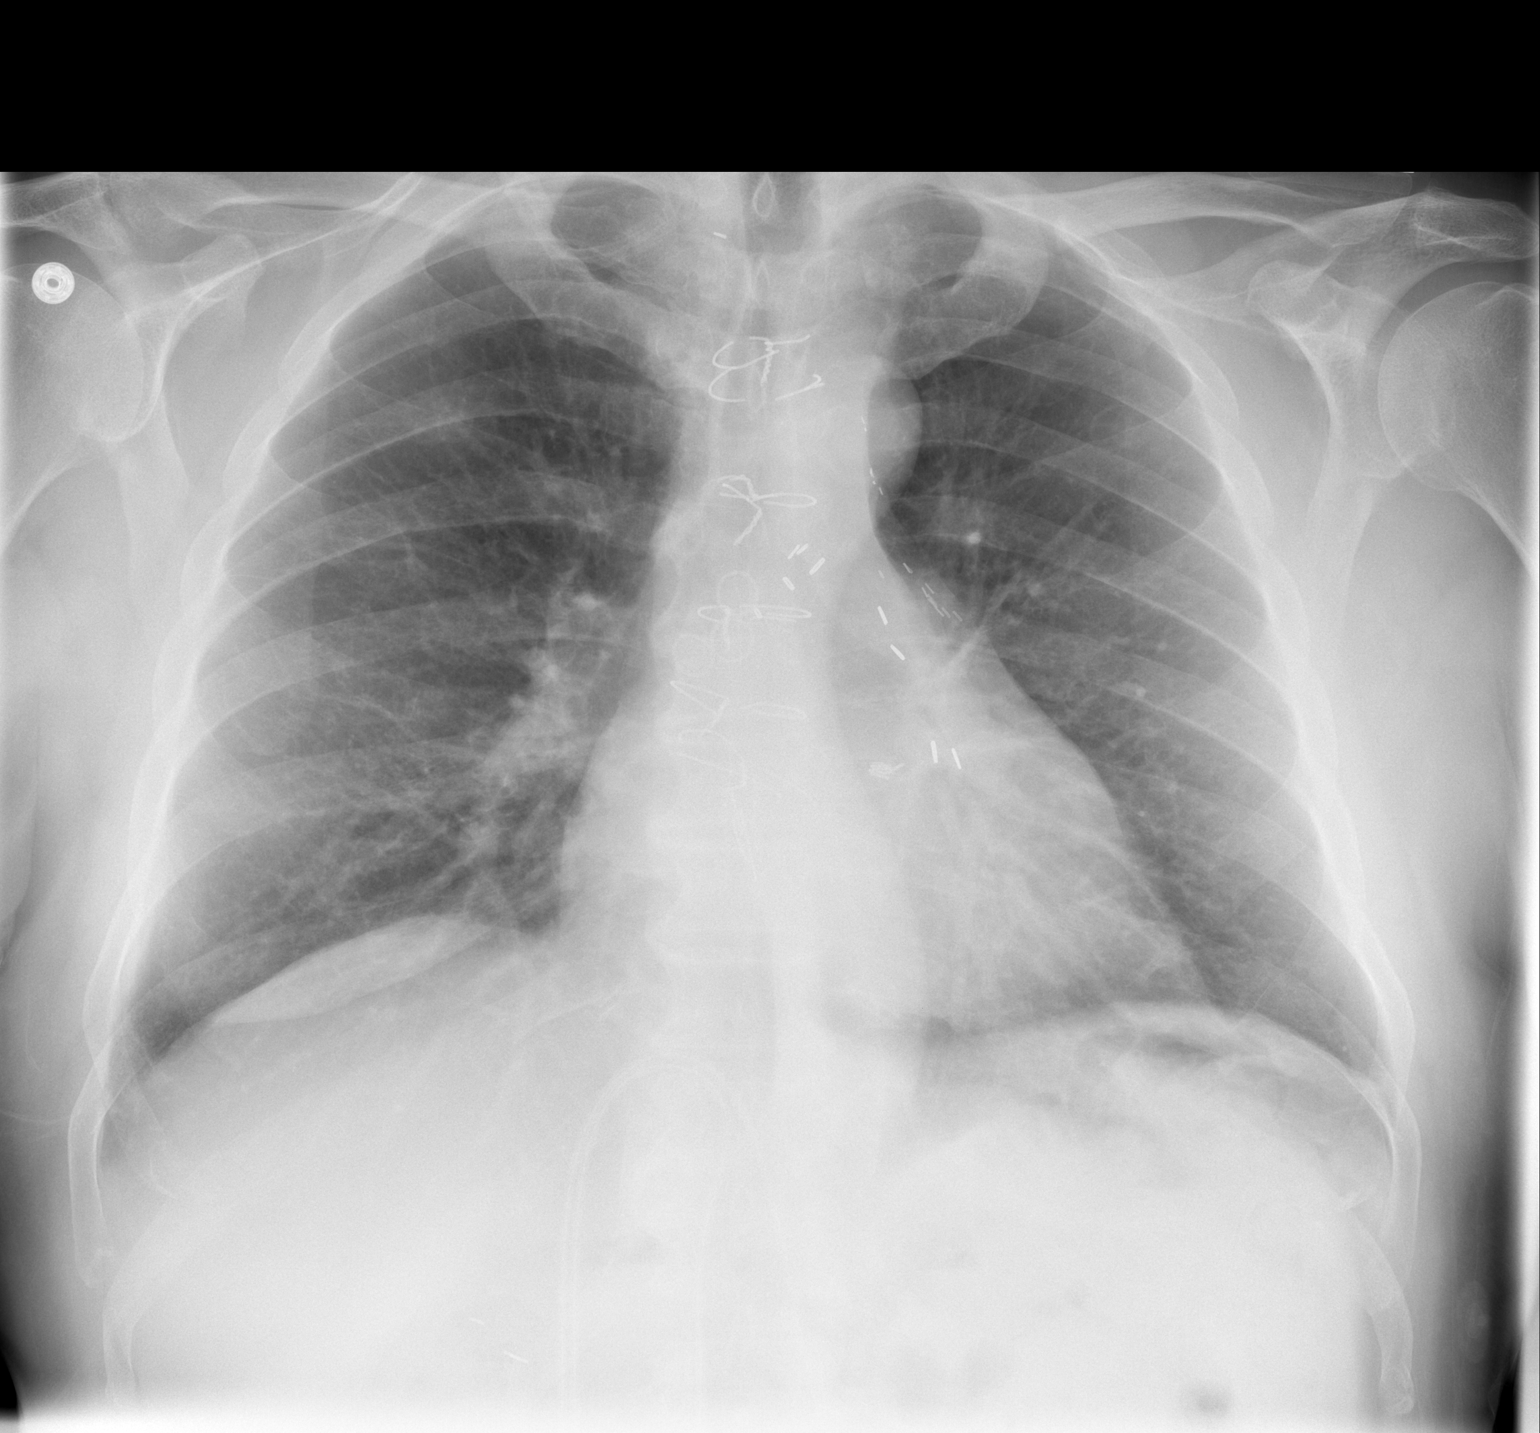

[w chest lat]
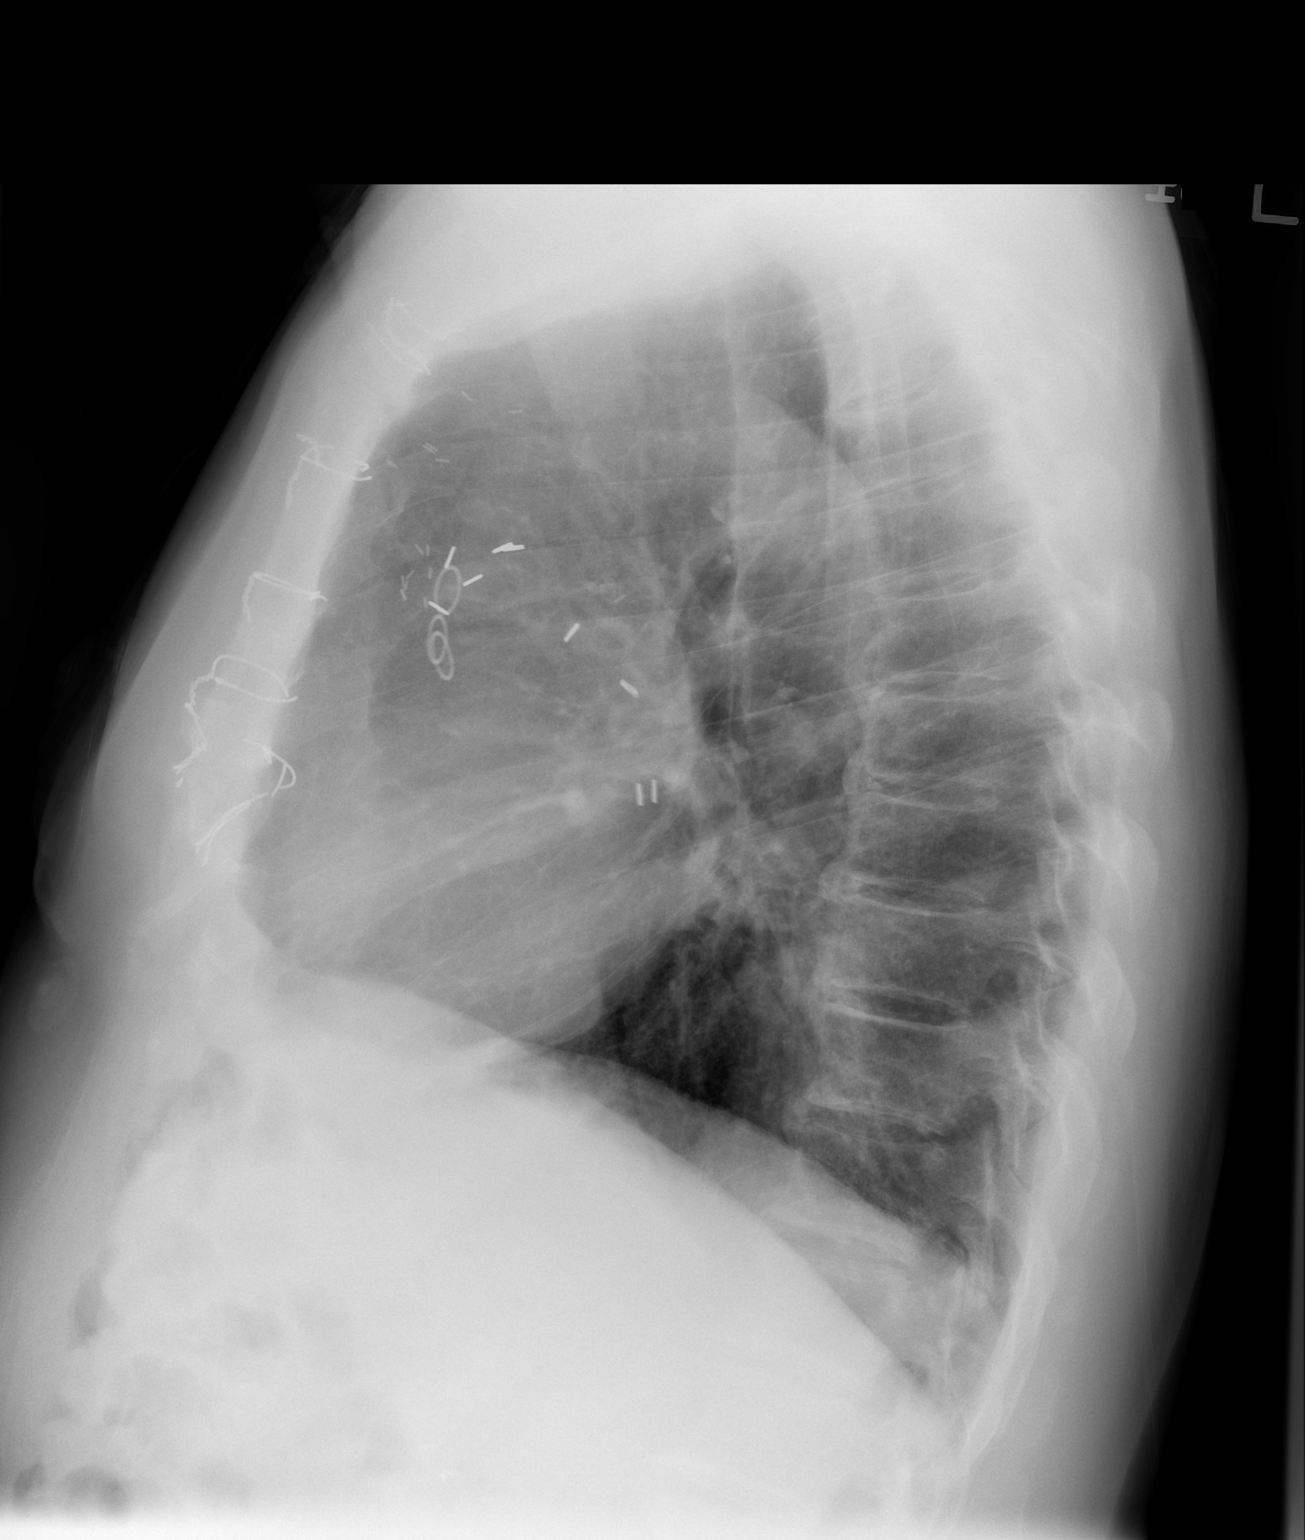

[2 of 2 positions shown; findings below may reference images not displayed]

FINDINGS: There has been previous median sternotomy and CABG.
Heart size is normal.  The aorta is unfolded.  The vascularity is
normal.  The lungs are clear.  No effusions.  No focal pulmonary
lesion.  Lead shadow overlies the right upper lobe.
IMPRESSION: Prior CABG.  No active process evident.

## 2011-03-19 IMAGING — CT CT HEAD W/O CM
1 of 2 series · 13 of 30 positions shown, 17 images · non-contrast
Comparison: 08/15/2007

CLINICAL DATA: Weakness.  Left arm numbness.  Vascular disease.
Hypertension.

CT HEAD WITHOUT CONTRAST
TECHNIQUE: Contiguous axial images were obtained from the base of
the skull through the vertex without contrast.

[Series 2: brain · axial · 0.47mm/px · z∈[+134,+274]mm · 13 of 32 slices shown, 17 images]
[im 3/32  brain]
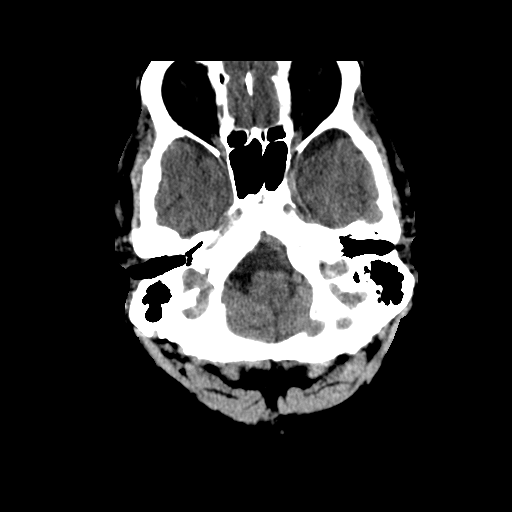
[im 3/32  bone]
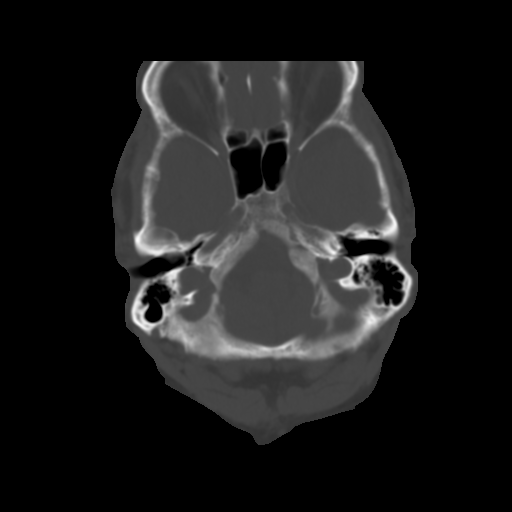
[im 5/32  brain]
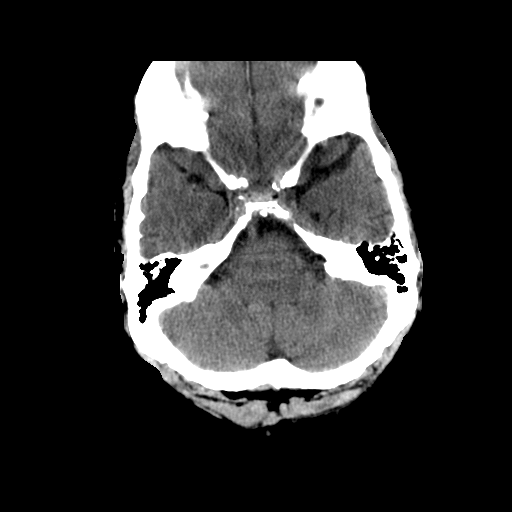
[im 7/32  brain]
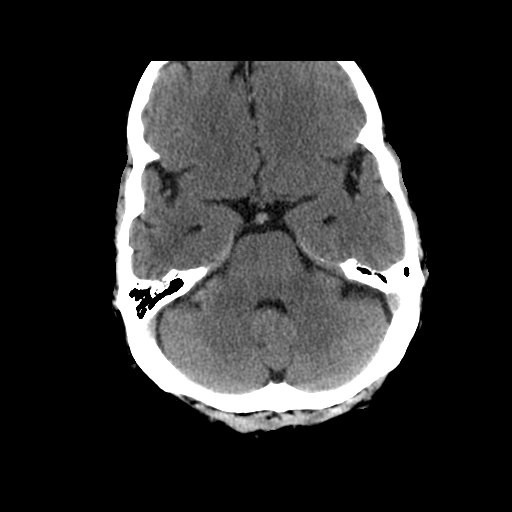
[im 9/32  brain]
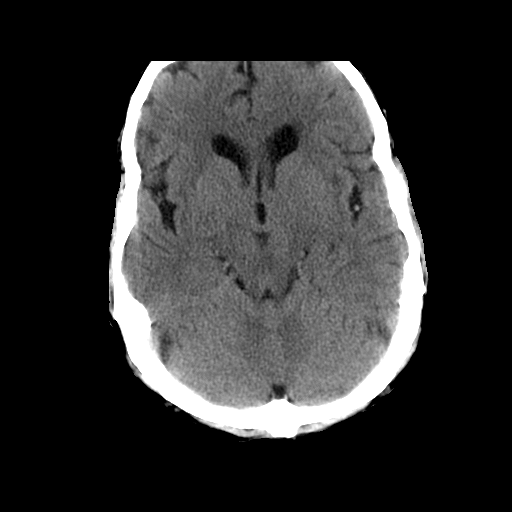
[im 12/32  brain]
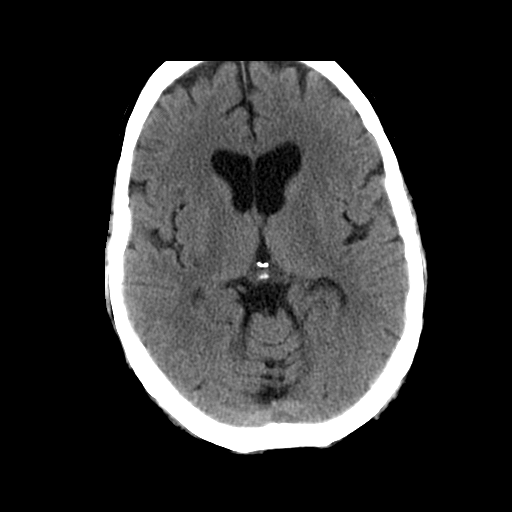
[im 12/32  bone]
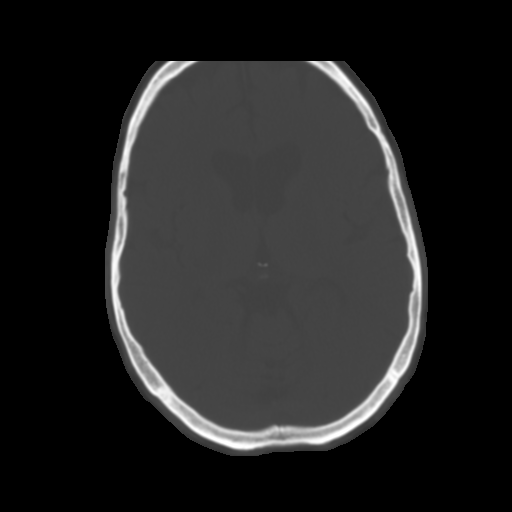
[im 14/32  brain]
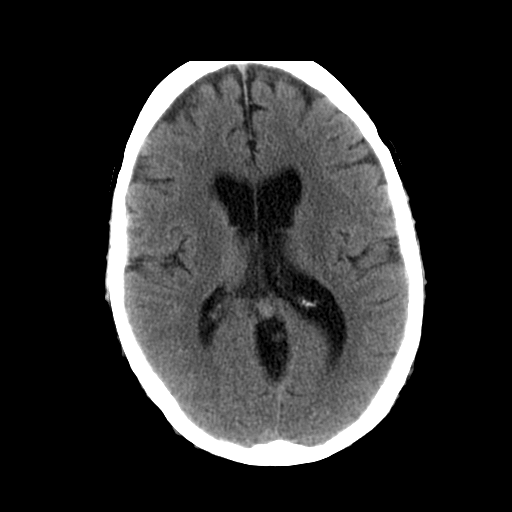
[im 16/32  brain]
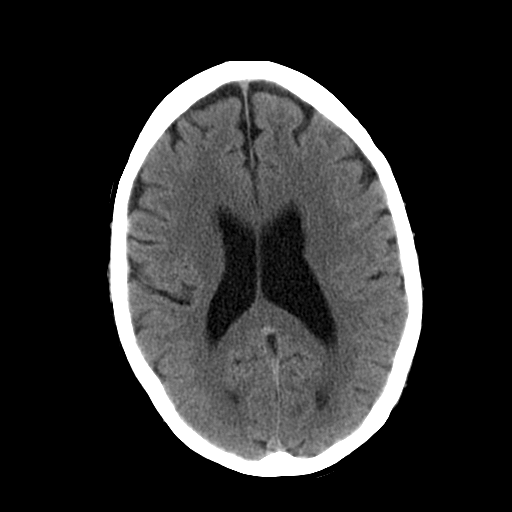
[im 18/32  brain]
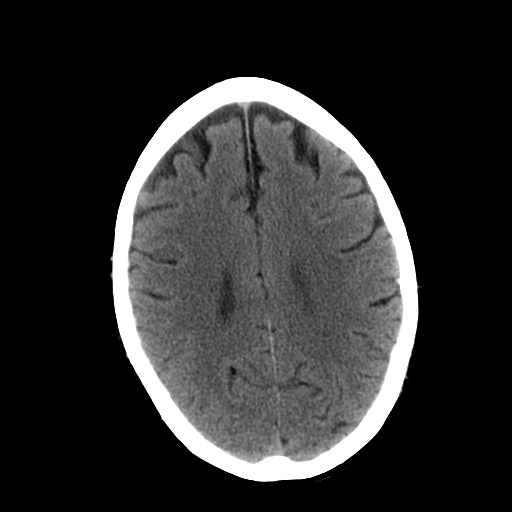
[im 20/32  brain]
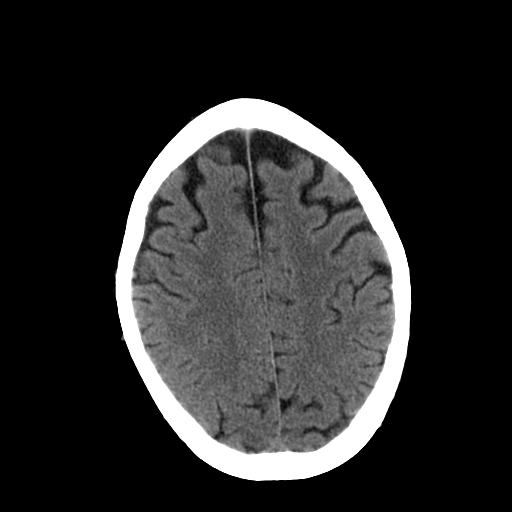
[im 20/32  bone]
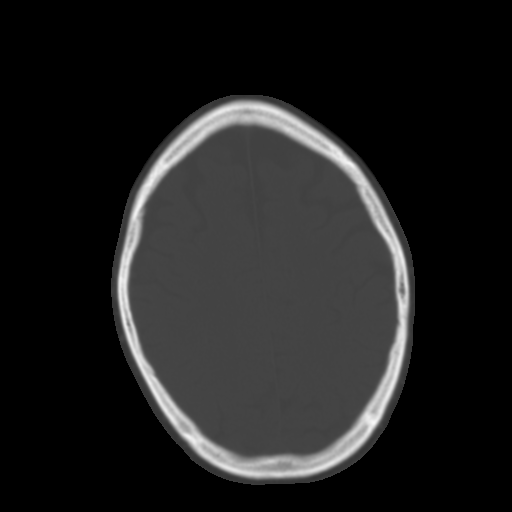
[im 23/32  brain]
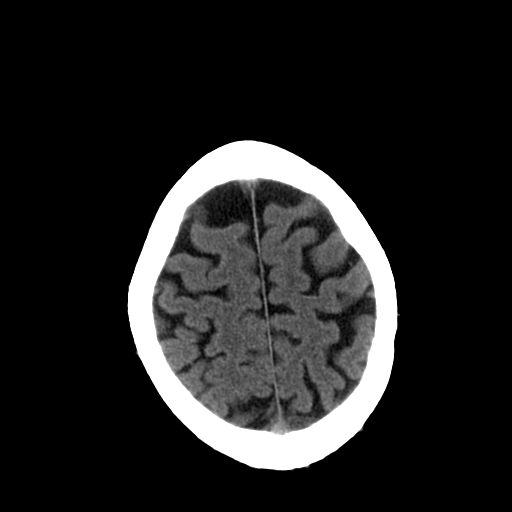
[im 25/32  brain]
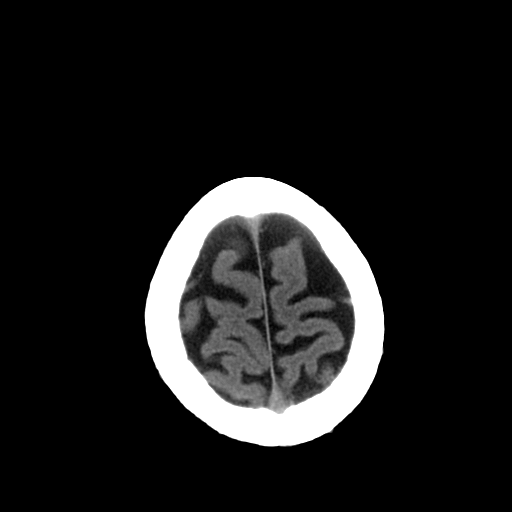
[im 27/32  brain]
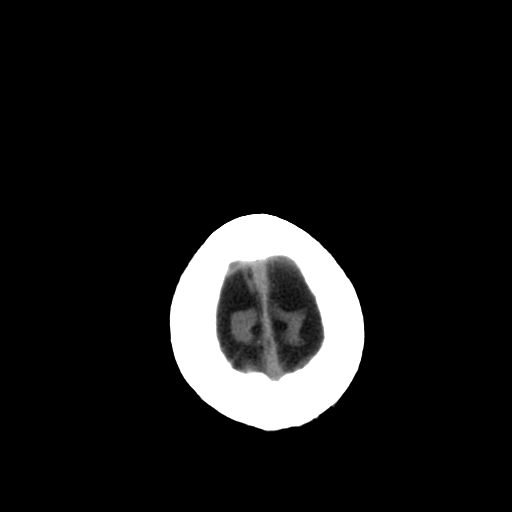
[im 29/32  brain]
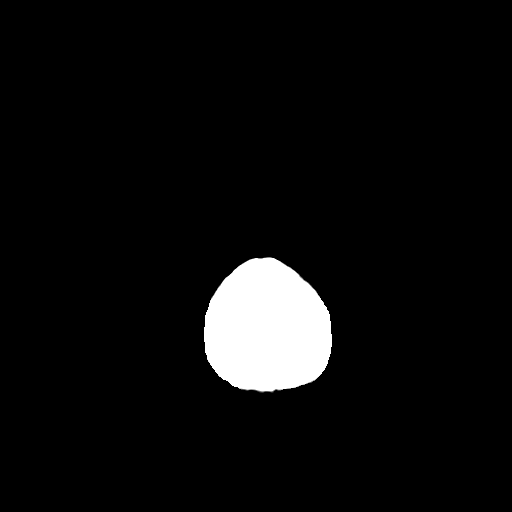
[im 29/32  bone]
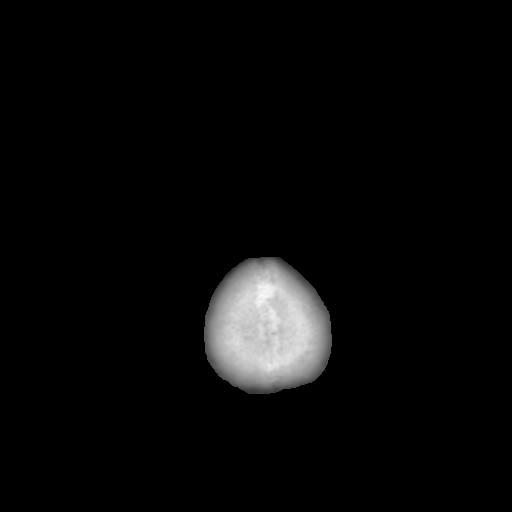

[13 of 30 positions shown; findings below may reference images not displayed]

FINDINGS: There is mild age related atrophy.  There is no evidence
of old [place] acute small or large vessel infarction.  No mass
lesion, hemorrhage, hydrocephalus or extra-axial collection.
Calvarium is unremarkable.  The sinuses, middle ears and mastoids
are clear.
IMPRESSION: Age related atrophy.  No focal or acute finding.

## 2011-12-01 ENCOUNTER — Other Ambulatory Visit: Payer: Self-pay | Admitting: Cardiology

## 2011-12-02 ENCOUNTER — Encounter (HOSPITAL_COMMUNITY)
Admission: RE | Disposition: A | Discharge status: Home / Self Care | Payer: Self-pay | Source: Ambulatory Visit | Attending: Cardiology

## 2011-12-02 ENCOUNTER — Ambulatory Visit (HOSPITAL_COMMUNITY)
Admission: RE | Admit: 2011-12-02 | Discharge: 2011-12-03 | Disposition: A | Discharge status: Home / Self Care | Payer: Medicare Other | Source: Ambulatory Visit | Attending: Cardiology | Admitting: Cardiology

## 2011-12-02 ENCOUNTER — Encounter (HOSPITAL_COMMUNITY): Payer: Self-pay | Admitting: Cardiology

## 2011-12-02 DIAGNOSIS — N400 Enlarged prostate without lower urinary tract symptoms: Secondary | ICD-10-CM | POA: Insufficient documentation

## 2011-12-02 DIAGNOSIS — I480 Paroxysmal atrial fibrillation: Secondary | ICD-10-CM | POA: Insufficient documentation

## 2011-12-02 DIAGNOSIS — E118 Type 2 diabetes mellitus with unspecified complications: Secondary | ICD-10-CM | POA: Diagnosis present

## 2011-12-02 DIAGNOSIS — R0602 Shortness of breath: Secondary | ICD-10-CM | POA: Insufficient documentation

## 2011-12-02 DIAGNOSIS — E785 Hyperlipidemia, unspecified: Secondary | ICD-10-CM | POA: Insufficient documentation

## 2011-12-02 DIAGNOSIS — E119 Type 2 diabetes mellitus without complications: Secondary | ICD-10-CM | POA: Insufficient documentation

## 2011-12-02 DIAGNOSIS — K219 Gastro-esophageal reflux disease without esophagitis: Secondary | ICD-10-CM | POA: Diagnosis not present

## 2011-12-02 DIAGNOSIS — I4891 Unspecified atrial fibrillation: Secondary | ICD-10-CM | POA: Insufficient documentation

## 2011-12-02 DIAGNOSIS — E1169 Type 2 diabetes mellitus with other specified complication: Secondary | ICD-10-CM | POA: Diagnosis present

## 2011-12-02 DIAGNOSIS — I10 Essential (primary) hypertension: Secondary | ICD-10-CM | POA: Diagnosis present

## 2011-12-02 DIAGNOSIS — R001 Bradycardia, unspecified: Secondary | ICD-10-CM | POA: Diagnosis present

## 2011-12-02 DIAGNOSIS — I200 Unstable angina: Secondary | ICD-10-CM | POA: Insufficient documentation

## 2011-12-02 DIAGNOSIS — M109 Gout, unspecified: Secondary | ICD-10-CM | POA: Diagnosis present

## 2011-12-02 DIAGNOSIS — Z951 Presence of aortocoronary bypass graft: Secondary | ICD-10-CM | POA: Insufficient documentation

## 2011-12-02 DIAGNOSIS — I2510 Atherosclerotic heart disease of native coronary artery without angina pectoris: Secondary | ICD-10-CM | POA: Insufficient documentation

## 2011-12-02 HISTORY — DX: Atherosclerotic heart disease of native coronary artery without angina pectoris: I25.10

## 2011-12-02 HISTORY — DX: Gastro-esophageal reflux disease without esophagitis: K21.9

## 2011-12-02 HISTORY — DX: Calculus of kidney: N20.0

## 2011-12-02 HISTORY — DX: Hypertension secondary to endocrine disorders: I15.2

## 2011-12-02 HISTORY — DX: Paroxysmal atrial fibrillation: I48.0

## 2011-12-02 HISTORY — DX: Gout, unspecified: M10.9

## 2011-12-02 HISTORY — DX: Type 2 diabetes mellitus with other circulatory complications: E11.59

## 2011-12-02 HISTORY — DX: Unspecified malignant neoplasm of skin, unspecified: C44.90

## 2011-12-02 HISTORY — DX: Coronary angioplasty status: Z98.61

## 2011-12-02 HISTORY — DX: Unspecified osteoarthritis, unspecified site: M19.90

## 2011-12-02 HISTORY — DX: Essential (primary) hypertension: I10

## 2011-12-02 HISTORY — DX: Hyperlipidemia, unspecified: E78.5

## 2011-12-02 HISTORY — DX: Dyspnea, unspecified: R06.00

## 2011-12-02 HISTORY — DX: Headache: R51

## 2011-12-02 HISTORY — PX: CORONARY ANGIOPLASTY WITH STENT PLACEMENT: SHX49

## 2011-12-02 HISTORY — PX: LEFT HEART CATHETERIZATION WITH CORONARY/GRAFT ANGIOGRAM: SHX5450

## 2011-12-02 HISTORY — DX: Other forms of dyspnea: R06.09

## 2011-12-02 HISTORY — DX: Angina pectoris, unspecified: I20.9

## 2011-12-02 HISTORY — DX: Bursitis of right shoulder: M75.51

## 2011-12-02 HISTORY — DX: Benign prostatic hyperplasia without lower urinary tract symptoms: N40.0

## 2011-12-02 LAB — CBC
MCV: 93.3 fL (ref 78.0–100.0)
Platelets: 227 10*3/uL (ref 150–400)
RBC: 4.16 MIL/uL — ABNORMAL LOW (ref 4.22–5.81)
WBC: 7.1 10*3/uL (ref 4.0–10.5)

## 2011-12-02 LAB — GLUCOSE, CAPILLARY
Glucose-Capillary: 164 mg/dL — ABNORMAL HIGH (ref 70–99)
Glucose-Capillary: 122 mg/dL — ABNORMAL HIGH (ref 70–99)
Glucose-Capillary: 229 mg/dL — ABNORMAL HIGH (ref 70–99)
Glucose-Capillary: 187 mg/dL — ABNORMAL HIGH (ref 70–99)

## 2011-12-02 LAB — BASIC METABOLIC PANEL
CO2: 24 mEq/L (ref 19–32)
Chloride: 101 mEq/L (ref 96–112)
GFR calc Af Amer: 61 mL/min — ABNORMAL LOW (ref >90)
Potassium: 4.1 mEq/L (ref 3.5–5.1)
Sodium: 135 mEq/L (ref 135–145)

## 2011-12-02 LAB — PROTIME-INR
INR: 1.03 (ref 0.00–1.49)
Prothrombin Time: 13.7 seconds (ref 11.6–15.2)

## 2011-12-02 SURGERY — LEFT HEART CATHETERIZATION WITH CORONARY/GRAFT ANGIOGRAM
Anesthesia: LOCAL

## 2011-12-02 MED ORDER — ZOLPIDEM TARTRATE 5 MG PO TABS: 5.0000 mg | ORAL_TABLET | Freq: Every evening | ORAL | Status: DC | PRN

## 2011-12-02 MED ORDER — SODIUM CHLORIDE 0.9 % IJ SOLN: 3.0000 mL | INTRAMUSCULAR | Status: DC | PRN

## 2011-12-02 MED ORDER — ATORVASTATIN CALCIUM 10 MG PO TABS
10.0000 mg | ORAL_TABLET | Freq: Every day | ORAL | Status: DC
  Administered 2011-12-02: 10 mg via ORAL
  Filled 2011-12-02 (×2): qty 1

## 2011-12-02 MED ORDER — LIDOCAINE HCL (PF) 1 % IJ SOLN
INTRAMUSCULAR | Status: AC
  Filled 2011-12-02: qty 30

## 2011-12-02 MED ORDER — ASPIRIN EC 81 MG PO TBEC
81.0000 mg | DELAYED_RELEASE_TABLET | Freq: Every day | ORAL | Status: DC
  Administered 2011-12-03: 81 mg via ORAL
  Filled 2011-12-02 (×2): qty 1

## 2011-12-02 MED ORDER — DIAZEPAM 5 MG PO TABS
5.0000 mg | ORAL_TABLET | ORAL | Status: AC
  Administered 2011-12-02: 5 mg via ORAL
  Filled 2011-12-02: qty 1

## 2011-12-02 MED ORDER — BIVALIRUDIN 250 MG IV SOLR
INTRAVENOUS | Status: AC
  Filled 2011-12-02: qty 250

## 2011-12-02 MED ORDER — DIGOXIN 250 MCG PO TABS
250.0000 ug | ORAL_TABLET | Freq: Every day | ORAL | Status: DC
  Filled 2011-12-02: qty 1

## 2011-12-02 MED ORDER — SODIUM CHLORIDE 0.9 % IV SOLN
INTRAVENOUS | Status: DC
  Administered 2011-12-02: 09:00:00 via INTRAVENOUS

## 2011-12-02 MED ORDER — TICAGRELOR 90 MG PO TABS
ORAL_TABLET | ORAL | Status: AC
  Administered 2011-12-03: 90 mg via ORAL
  Filled 2011-12-02: qty 2

## 2011-12-02 MED ORDER — TRAMADOL HCL 50 MG PO TABS
50.0000 mg | ORAL_TABLET | Freq: Four times a day (QID) | ORAL | Status: DC | PRN
  Filled 2011-12-02: qty 1

## 2011-12-02 MED ORDER — HEPARIN SODIUM (PORCINE) 1000 UNIT/ML IJ SOLN
INTRAMUSCULAR | Status: AC
  Filled 2011-12-02: qty 1

## 2011-12-02 MED ORDER — DIGOXIN 250 MCG PO TABS
0.2500 mg | ORAL_TABLET | Freq: Every day | ORAL | Status: DC
  Filled 2011-12-02 (×2): qty 1

## 2011-12-02 MED ORDER — FENTANYL CITRATE 0.05 MG/ML IJ SOLN
INTRAMUSCULAR | Status: AC
Start: 2011-12-02 — End: 2011-12-02
  Filled 2011-12-02: qty 2

## 2011-12-02 MED ORDER — CARVEDILOL 3.125 MG PO TABS
3.1250 mg | ORAL_TABLET | Freq: Two times a day (BID) | ORAL | Status: DC
  Administered 2011-12-02 – 2011-12-03 (×2): 3.125 mg via ORAL
  Filled 2011-12-02 (×2): qty 1

## 2011-12-02 MED ORDER — NITROGLYCERIN 0.4 MG SL SUBL: 0.4000 mg | SUBLINGUAL_TABLET | SUBLINGUAL | Status: DC | PRN

## 2011-12-02 MED ORDER — FINASTERIDE 5 MG PO TABS
5.0000 mg | ORAL_TABLET | Freq: Every evening | ORAL | Status: DC
  Administered 2011-12-02: 5 mg via ORAL
  Filled 2011-12-02 (×2): qty 1

## 2011-12-02 MED ORDER — TICAGRELOR 90 MG PO TABS
90.0000 mg | ORAL_TABLET | Freq: Two times a day (BID) | ORAL | Status: DC
  Administered 2011-12-03: 90 mg via ORAL
  Filled 2011-12-02 (×2): qty 1

## 2011-12-02 MED ORDER — ALLOPURINOL 100 MG PO TABS
200.0000 mg | ORAL_TABLET | Freq: Every evening | ORAL | Status: DC
  Administered 2011-12-02: 200 mg via ORAL
  Filled 2011-12-02 (×2): qty 2

## 2011-12-02 MED ORDER — LOSARTAN POTASSIUM-HCTZ 50-12.5 MG PO TABS: 1.0000 | ORAL_TABLET | Freq: Every day | ORAL | Status: DC

## 2011-12-02 MED ORDER — INSULIN GLARGINE 100 UNIT/ML ~~LOC~~ SOLN
45.0000 [IU] | Freq: Every day | SUBCUTANEOUS | Status: DC
  Administered 2011-12-02: 45 [IU] via SUBCUTANEOUS

## 2011-12-02 MED ORDER — ALPRAZOLAM 0.25 MG PO TABS: 0.2500 mg | ORAL_TABLET | Freq: Three times a day (TID) | ORAL | Status: DC | PRN

## 2011-12-02 MED ORDER — PANTOPRAZOLE SODIUM 40 MG PO TBEC
40.0000 mg | DELAYED_RELEASE_TABLET | Freq: Every day | ORAL | Status: DC
  Administered 2011-12-02: 40 mg via ORAL
  Filled 2011-12-02: qty 1

## 2011-12-02 MED ORDER — PANTOPRAZOLE SODIUM 40 MG PO TBEC
40.0000 mg | DELAYED_RELEASE_TABLET | Freq: Every day | ORAL | Status: DC
  Filled 2011-12-02: qty 1

## 2011-12-02 MED ORDER — MIDAZOLAM HCL 2 MG/2ML IJ SOLN
INTRAMUSCULAR | Status: AC
  Filled 2011-12-02: qty 2

## 2011-12-02 MED ORDER — ASPIRIN EC 81 MG PO TBEC
81.0000 mg | DELAYED_RELEASE_TABLET | Freq: Every day | ORAL | Status: DC
  Filled 2011-12-02: qty 1

## 2011-12-02 MED ORDER — ONDANSETRON HCL 4 MG/2ML IJ SOLN: 4.0000 mg | Freq: Four times a day (QID) | INTRAMUSCULAR | Status: DC | PRN

## 2011-12-02 MED ORDER — ACETAMINOPHEN 325 MG PO TABS: 650.0000 mg | ORAL_TABLET | ORAL | Status: DC | PRN

## 2011-12-02 MED ORDER — ATORVASTATIN CALCIUM 10 MG PO TABS
10.0000 mg | ORAL_TABLET | Freq: Every evening | ORAL | Status: DC
  Filled 2011-12-02: qty 1

## 2011-12-02 MED ORDER — LOSARTAN POTASSIUM 50 MG PO TABS
50.0000 mg | ORAL_TABLET | Freq: Every day | ORAL | Status: DC
  Administered 2011-12-03: 50 mg via ORAL
  Filled 2011-12-02 (×2): qty 1

## 2011-12-02 MED ORDER — VERAPAMIL HCL 2.5 MG/ML IV SOLN
INTRAVENOUS | Status: AC
  Filled 2011-12-02: qty 2

## 2011-12-02 MED ORDER — HYDROCHLOROTHIAZIDE 12.5 MG PO CAPS
12.5000 mg | ORAL_CAPSULE | Freq: Every day | ORAL | Status: DC
  Administered 2011-12-03: 12.5 mg via ORAL
  Filled 2011-12-02 (×2): qty 1

## 2011-12-02 MED ORDER — EZETIMIBE 10 MG PO TABS
10.0000 mg | ORAL_TABLET | Freq: Every evening | ORAL | Status: DC
  Administered 2011-12-02: 10 mg via ORAL
  Filled 2011-12-02 (×2): qty 1

## 2011-12-02 MED ORDER — POLYETHYLENE GLYCOL 3350 17 G PO PACK
17.0000 g | PACK | Freq: Every day | ORAL | Status: DC | PRN
  Filled 2011-12-02: qty 1

## 2011-12-02 MED ORDER — ASPIRIN 81 MG PO CHEW
CHEWABLE_TABLET | ORAL | Status: AC
  Filled 2011-12-02: qty 3

## 2011-12-02 MED ORDER — NITROGLYCERIN 0.2 MG/ML ON CALL CATH LAB
INTRAVENOUS | Status: AC
  Filled 2011-12-02: qty 1

## 2011-12-02 MED ORDER — MORPHINE SULFATE 4 MG/ML IJ SOLN: 2.0000 mg | INTRAMUSCULAR | Status: DC | PRN

## 2011-12-02 MED ORDER — HEPARIN (PORCINE) IN NACL 2-0.9 UNIT/ML-% IJ SOLN
INTRAMUSCULAR | Status: AC
  Filled 2011-12-02: qty 2000

## 2011-12-02 MED ORDER — ALLOPURINOL 100 MG PO TABS
200.0000 mg | ORAL_TABLET | Freq: Every evening | ORAL | Status: DC
  Filled 2011-12-02: qty 2

## 2011-12-02 NOTE — Progress Notes (Signed)
TR BAND REMOVAL  LOCATION:  left radial  DEFLATED PER PROTOCOL:  yes  TIME BAND OFF / DRESSING APPLIED:   1730   SITE UPON ARRIVAL:   Level 0  SITE AFTER BAND REMOVAL:  Level 0  REVERSE ALLEN'S TEST:    positive  CIRCULATION SENSATION AND MOVEMENT:  Within Normal Limits  yes  COMMENTS:    

## 2011-12-02 NOTE — H&P (Signed)
History and Physical Interval Note:  NAME:  Dale Haynes   MRN: [id number] DOB:  Mar 25, 1936   ADMIT DATE: 12/02/2011   12/02/2011 8:09 AM  Dale Haynes is a 76 y.o. male who is a pt of Dr. Caprice Kluver with the PMH noted below. He was seen by Dr. Clarene Duke on 7/1 urgently with a ~3 day history of Left Arm numbness and tingling associated with a "chest awareness" -- similar to his original Sx at the time of his CABG in 1996.  The Sx were relieved by NTG.  The initial episode occurred at rest, and then ~2 hrs later the Sx returned with more sensation in his chest.  This Sx has not been induced with mild activity, however walking to the mailbox, or taking a shower makes him profoundly breathless.  He is referred for invasive cardiac evaluation with Cardiac Catheterization, Coronary & Graft Angiography +/- PCI with the diagnosis of "recurrent unstable angina".   Past Medical History  Diagnosis Date  . CAD S/P percutaneous coronary angioplasty 1996 CABG, 2010 PCII    s/ CABG & PCI  (SVG-RCA 80% lesion stented  BMS 4.0 mm x 18 mm, post-dilated to ~4.6 mm)  . Paroxysmal atrial fibrillation   . Hypertension associated with diabetes   . Diabetes mellitus type 2 with complications     associated with CAD  . Hyperlipidemia LDL goal < 70   . GERD (gastroesophageal reflux disease)   . BPH (benign prostatic hyperplasia)   . Gout    Past Surgical History  Procedure Date  . Coronary artery bypass graft 1996    LIMA-LAD, SVG-DIAG, SVG-OM, SVG-RCA  . Arthroscopic surgery of the left shoulder 2006    FAMHx: Family History  Problem Relation Age of Onset  . Coronary artery disease      3 family members have died from CAD complications    SOCHx:  reports that he quit smoking about 19 years ago. His smoking use included Pipe. He has never used smokeless tobacco. He reports that he drinks alcohol. His drug history not on file.  ALLERGIES: Allergies not on file  HOME MEDICATIONS: No  prescriptions prior to admission    PHYSICAL EXAM:There were no vitals taken for this visit. General appearance: alert, cooperative, appears stated age and no distress Neck: no carotid bruit, no JVD, supple, symmetrical, trachea midline, thyroid not enlarged, symmetric, no tenderness/mass/nodules and mild cervical LAN Lungs: clear to auscultation bilaterally, normal percussion bilaterally and non-labored Heart: regular rate and rhythm, S1, S2 normal, no murmur, click, rub or gallop Abdomen: soft, non-tender; bowel sounds normal; no masses,  no organomegaly Extremities: edema mild edema in ankles Pulses: 2+ and symmetric Neurologic: Grossly normal; CN II-XII grossly intact.  IMPRESSION & PLAN The patients' history has been reviewed, patient examined, no change in status from most recent note, stable for surgery. I have reviewed the patients' chart and labs. Questions were answered to the patient's satisfaction.    Dale Haynes has presented today for surgery, with the diagnosis of recurrent unstable angina.  The various methods of treatment have been discussed with the patient and family.   Risks / Complications include, but not limited to: Death, MI, CVA/TIA, VF/VT (with defibrillation), Bradycardia (need for temporary pacer placement), contrast induced nephropathy, bleeding / bruising / hematoma / pseudoaneurysm, vascular or coronary injury (with possible emergent CT or Vascular Surgery), adverse medication reactions, infection.     After consideration of risks, benefits and other options for treatment, the patient  has consented to Procedure(s):  LEFT HEART CATHETERIZATION AND CORONARY & GRAFT ANGIOGRAPHY +/- AD HOC PERCUTANEOUS CORONARY INTERVENTION  as a surgical intervention.   We will proceed with the planned procedure.   Ynez Eugenio W [facility] [address] [place], [place]  [address]  [phone]  12/02/2011 8:09 AM

## 2011-12-02 NOTE — CV Procedure (Signed)
[facility] PERCUTANEOUS CORONARY INTERVENTION REPORT  NAME:  Dale Haynes   MRN: [id number] DOB:  [birth year]   ADMIT DATE: 12/02/2011  INTERVENTIONAL CARDIOLOGIST: Marykay Lex, M.D., MS PRIMARY CARE PROVIDER: No primary provider on file. PRIMARY CARDIOLOGIST: Julieanne Manson, MD  PATIENT:  Dale Haynes is a 76 y.o. male with a PMH of CABGx4 in 1996 who had PCI with a 4.0 x 18 mm BMS to the RCA for unstable angina in 2010.  He was seen by Dr. Clarene Duke on 7/1 urgently with a ~3 day history of Left Arm numbness and tingling associated with a "chest awareness" -- similar to his original Sx at the time of his CABG in 1996. The Sx were relieved by NTG. The initial episode occurred at rest, and then ~2 hrs later the Sx returned with more sensation in his chest. This Sx has not been induced with mild activity, however walking to the mailbox, or taking a shower makes him profoundly breathless. He is referred for urgent cardiac catheterization.  PRE-OPERATIVE DIAGNOSIS:    Unstable Angina  Shortness of Breath on Exertion  History of CABG x 4 with SVG PCI  PROCEDURES PERFORMED:    Left Heart Catheterization via 5 Fr Left Radial Artery access  Left Ventriculography, (RAO) 12 ml/sec for 25 ml total contrast  Native Coronary Angiography  Internal Mammary Graft Angiography  Saphenous Vein Graft Graft Angiography)  IC NTG Injection  Percutaneous Coronary Artery Intervention on proximal Native Diag 1 with a 2.5 mm x 12 mm Promus Element DES; final diameter 2.75 mm  PROCEDURE: Consent: The procedure with Risks/Benefits/Alternatives and Indications was reviewed with the patient (and wife).  All questions were answered.    Risks / Complications include, but not limited to: Death, MI, CVA/TIA, VF/VT (with defibrillation), Bradycardia (need for temporary pacer placement), contrast induced nephropathy, bleeding / bruising / hematoma / pseudoaneurysm, vascular  or coronary injury (with possible emergent CT or Vascular Surgery), adverse medication reactions, infection.    The patient and wife voiced understanding and agree to proceed.    Consent for signed by MD and patient with RN witness -- placed on chart.  Procedure: The patient was brought to the 2nd Floor [facility] in the fasting state and prepped and draped in the usual sterile fashion for Left radial or Right groin access. A modified Allen's test with plethysmography was performed on the Left wrist demonstrating adequate Ulnar Artery collateral flow. Sterile technique was used including antiseptics, cap, gloves, gown, hand hygiene, mask and sheetSkin prep: Chlorhexidine;  Time Out: Verified patient identification, verified procedure, site/side was marked, verified correct patient position, special equipment/implants available, medications/allergies/relevent history reviewed, required imaging and test results available.  Performed   Access:  Left Radial Artery; 5 Fr Sheath, Seldinger technique  Diagnostic Angiography:  Initial catheter advanced over a Versicore wire, exchanges over Long-exchange safety-J wire.  TIG 4.0 -- Left Coronary Artery (Left Circumflex), Right Coronary Artery, SVG-RCA, SVG-OM, SVG-Diag   IMA catheter - LIMA-LAD    JL 4 -- selective LAD angiography to better evaluate the grafted Native Diagonal  Angled Pigtail -- LV hemodynamics, LV Gram.  Hemodynamics:  Central Aortic / Mean Pressures: 140/70 mmHg; 98 mmHg  Left Ventricular Pressures / EDP: 139/5 mmHg; 11 mmHg  Left Ventriculography:  EF: 55-60 %  Wall Motion: no regional abnormality  Coronary Anatomy:  Left Main: Large Caliber, bifurcates normally into LAD and Left Circumflex LAD: Large caliber vessel; proximal to mid  vessel has diffuse mild disease until shortly after Diag 2 has a severe ~80-90% lesion followed by a diffusely disease segment until the LIMA anastomosis where  there is bidirectional flow - with native flow reaching the distal LAD. LIMA-LAD:  Widely patent, fills the distal LAD that has minimal luminal irregularities and reaches around the tip of the apex.  Diag1: Moderate caliber vessel that takes a ~90degree takeoff from the LAD; there is a focal ~75-80% lesion at the first bend of the artery followed by a normal remainder of the vessel.  There is the distal stump of the SVG visible with static flow in the early mid vessel. SVG-Diag1: 100% flush occluded at the Aorto-ostium.  Diag2: Small to moderate caliber vessel that takes a parallel course to Diag1; diffuse luminal irregularities. Left Circumflex: Moderate to large vessel that gives off OM1 & a proximally occluded, grafted OM2 before terminating as the AV Groove branch with a large atrial branch and a small postero-lateral branch.  OM1: Small caliber vessel; diffuse luminal irregularities  OM 2: Proximally occluded. -- fills antegrade via SVG-OM. SVG-OM2:  Large caliber, widely patent graft; retrograde flow perfuses a a proximal branch the reaches what looks to be the native AVG Circumflex.  RCA: Moderate to large caliber diffusely diseased vessel with moderate to severe lesions in its entirety until it is 100% occluded just prior to the genu. SVG-RCA:  Large caliber, widely patent graft that appears to touch down at the bifurcation into the Right Posterior Descending Artery (RPDA) and the Right Posterior AV Groove branch (RPAV). The proximal stent is widely patent.  RPDA: Moderate caliber vessel that courses ~2/3 of the way to the apex; diffuse luminal irregularities.  RPL Sysytem: Small to moderate caliber RPAV gives off one small and one larger posterolateral branch; diffuse luminal irregularities.  After confirming that the SVG-Diag was indeed occluded on direct visualization, a second attempt at [facility] angiography with the JL4 catheter was performed.  With this catheter, sub-selective LAD  images revealed the 75-80% proximal lesion in Diag1 with distal SVG contrast staining, suggesting relatively recent graft occlusion consistent with the timing of his symptoms.  I therefore chose to intervene on the Native Diag1 lesion to re-establish native in-line flow.  Percutaneous Coronary Intervention:  Native Diag 2 proximal 75-80% lesion. Sheath exchanged for 6 Fr. Angiomax Bolus & gtt, Brilinta 180mg  PO administered Guide: 6 Fr  JL4 Guidewire: Prowater  Predilation Balloon: Emerge MR 2.0 mm x 8 mm; 8 Atm x 30 Sec  IC NTG 200 mcg x 2 to allow for stent passage.  Stent: Promus Element DES 2.0 mm x 12 mm; 12 Atm x 30 Sec  Post-dilation Balolon:   Quantum Apex 2.75 mm x 8 mm; 14 Atm x 60 Sec -- final diameter 2.75 mm  IC NTG  Post-deployment cineangiography with and without guidewire in place was performed in multiple orthogonal views demonstrating excellent stent deployment and did not reveal evidence of dissection or perforation. The catheter was then removed completely out of the body over the long-exchange Safety J wire.  TR Band:  1325 Hours, 13 mL air  ANESTHESIA:   Local Lidocaine 2 ml SEDATION:  1 mg IV Versed, 50 mcg IV Fentanyl ; Premedication: 5mg  PO Valium MEDICATIONS:   Angiomax Bolus & gtt, stopped at completion of the PCI.  Brilinta 90 mg PO  IC NTG 200 mcg x 3  EBL:   < 5 ml  PATIENT DISPOSITION:    The patient was transferred  to the PACU holding area in a hemodynamicaly stable, chest pain free condition.  The patienttolerated the procedure well, and there were no complications.  The patientwas stable before, during, and after the procedure.  POST-OPERATIVE DIAGNOSIS:    Recent 100% occlusion of the SVG-Diag1 as the likely culprit for recent unstable angina.  Status/Post Successful PCI of theproximal Native Diag1 lesion with a Promus Element DES 2.5 mm x 12 mm, post-dilated to 2.75 mm.  Widely patent BMS Stent in the SVG-RCA along with there  rest of the graft.  Widely patent SVG-OM and LIMA-LAD.  Normal, preserved LVEF with normal LVEDP.  PLAN OF CARE:  Overnight observation as Outpatient with extended recovery.  Standard post Radial Catheterization care.  Continue Dual Antiplatelet Therapy for a minimum of 1 year.  Continue home medications.  Anticipate discharge in the AM if he remains stable.  ROV with Dr. Clarene Duke as scheduled.   Dr. Clarene Duke was notified of the results.  Marykay Lex, M.D., M.S. [facility] [address] [place], [place]  [address]  [phone]  12/02/2011 11:07 PM

## 2011-12-03 DIAGNOSIS — M109 Gout, unspecified: Secondary | ICD-10-CM | POA: Diagnosis present

## 2011-12-03 DIAGNOSIS — I200 Unstable angina: Secondary | ICD-10-CM | POA: Diagnosis present

## 2011-12-03 DIAGNOSIS — R001 Bradycardia, unspecified: Secondary | ICD-10-CM | POA: Diagnosis present

## 2011-12-03 LAB — BASIC METABOLIC PANEL
CO2: 24 mEq/L (ref 19–32)
Calcium: 9.1 mg/dL (ref 8.4–10.5)
Chloride: 101 mEq/L (ref 96–112)
Glucose, Bld: 110 mg/dL — ABNORMAL HIGH (ref 70–99)
Potassium: 3.7 mEq/L (ref 3.5–5.1)
Sodium: 138 mEq/L (ref 135–145)

## 2011-12-03 LAB — CBC
Hemoglobin: 13 g/dL (ref 13.0–17.0)
Platelets: 233 10*3/uL (ref 150–400)
RBC: 4.15 MIL/uL — ABNORMAL LOW (ref 4.22–5.81)
WBC: 8.4 10*3/uL (ref 4.0–10.5)

## 2011-12-03 LAB — GLUCOSE, CAPILLARY: Glucose-Capillary: 144 mg/dL — ABNORMAL HIGH (ref 70–99)

## 2011-12-03 LAB — POCT ACTIVATED CLOTTING TIME: Activated Clotting Time: 504 seconds

## 2011-12-03 MED ORDER — TICAGRELOR 90 MG PO TABS: 90.0000 mg | ORAL_TABLET | Freq: Two times a day (BID) | ORAL | Status: DC

## 2011-12-03 MED FILL — Dextrose Inj 5%: INTRAVENOUS | Qty: 50 | Status: AC

## 2011-12-03 NOTE — Progress Notes (Signed)
Subjective:  No chest pain  Objective:  Vital Signs in the last 24 hours: Temp:  [97.5 F (36.4 C)-98 F (36.7 C)] 98 F (36.7 C) (07/03 0026) Pulse Rate:  [45-54] 49  (07/03 0026) Resp:  [14-21] 20  (07/03 0026) BP: (122-144)/(48-79) 127/72 mmHg (07/03 0400) SpO2:  [96 %] 96 % (07/03 0400) Weight:  [95.3 kg (210 lb 1.6 oz)] 95.3 kg (210 lb 1.6 oz) (07/03 0026)  Intake/Output from previous day:  Intake/Output Summary (Last 24 hours) at 12/03/11 0850 Last data filed at 12/02/11 1700  Gross per 24 hour  Intake    480 ml  Output      0 ml  Net    480 ml    Physical Exam: General appearance: alert, cooperative and no distress Lungs: clear to auscultation bilaterally Heart: regular rate and rhythm Lt radial site without hematoma   Rate: 50  Rhythm: sinus bradycardia  Lab Results:  Basename 12/03/11 0407 12/02/11 0845  WBC 8.4 7.1  HGB 13.0 13.4  PLT 233 227    Basename 12/03/11 0407 12/02/11 0845  NA 138 135  K 3.7 4.1  CL 101 101  CO2 24 24  GLUCOSE 110* 163*  BUN 24* 31*  CREATININE 1.27 1.30   No results found for this basename: TROPONINI:2,CK,MB:2 in the last 72 hours Hepatic Function Panel No results found for this basename: PROT,ALBUMIN,AST,ALT,ALKPHOS,BILITOT,BILIDIR,IBILI in the last 72 hours No results found for this basename: CHOL in the last 72 hours  Basename 12/02/11 0845  INR 1.03    Imaging: No results found.  Cardiac Studies:  Assessment/Plan:   Principal Problem:  *Unstable angina, new onset Active Problems:  CABG '96, SVG-RCA BMS 2010, DES to SVG-Dx this 12/02/11  Diabetes mellitus type 2 with complications  Hypertension associated with diabetes  Bradycardia, as low as 45  Hyperlipidemia LDL goal < 70  GERD (gastroesophageal reflux disease)  BPH (benign prostatic hyperplasia)  Gout  Plan- Stop Lanoxin, continue Coreg low dose. Brilinta and ASA 81mg . Follow up Dr Herbie Baltimore.  Corine Shelter PA-C 12/03/2011, 8:50 AM  He looks great  today.   Feels better. His wife notes that he has better color.  He is ready for d/c.  Radial site looks great.  ROV with extender in 1-2 weeks then with me after.  Marykay Lex, M.D., M.S. [facility] [address] [place], [place]  [address]  [phone] Pager # [phone]  12/03/2011 9:02 AM

## 2011-12-03 NOTE — Discharge Summary (Signed)
Patient ID: Dale Haynes,  MRN: [id number], DOB/AGE: 1935-06-06 76 y.o.  Admit date: 12/02/2011 Discharge date: 12/03/2011  Primary Care Provider:  Primary Cardiologist: Dr Herbie Baltimore  Discharge Diagnoses Principal Problem:  *Unstable angina, new onset Active Problems:  CABG '96, SVG-RCA BMS 2010, DES to native Dx this 12/02/11  Diabetes mellitus type 2 with complications  Hypertension associated with diabetes  Bradycardia, as low as 45  Hyperlipidemia LDL goal < 70  GERD (gastroesophageal reflux disease)  BPH (benign prostatic hyperplasia)  Gout    Procedures: SVG-Dx DES 12/02/11   Hospital Course: Dale Haynes is a 76 y.o. male with a PMH of CABGx4 in 1996 who had PCI with a 4.0 x 18 mm BMS to the RCA for unstable angina in 2010. He was seen by Dr. Clarene Duke on 7/1 urgently with a ~3 day history of Left Arm numbness and tingling associated with a "chest awareness" -- similar to his original Sx at the time of his CABG in 1996. The Sx were relieved by NTG. The initial episode occurred at rest, and then ~2 hrs later the Sx returned with more sensation in his chest. This Sx has not been induced with mild activity, however walking to the mailbox, or taking a shower makes him profoundly breathless.He is referred for urgent cardiac catheterization. This was done 12/02/11 by Dr Herbie Baltimore with results noted below. He tolerated the procedure well and we feel he is stable for discharge 12/03/11. We did stop his Lanoxin because of bradycardia down to 45. He will see an extender in 1-2 weeks and then follow up with Dr Herbie Baltimore.  POST-OPERATIVE DIAGNOSIS:  Recent 100% occlusion of the SVG-Diag1 as the likely culprit for recent unstable angina.  Status/Post Successful PCI of theproximal Native Diag1 lesion with a Promus Element DES 2.5 mm x 12 mm, post-dilated to 2.75 mm.  Widely patent BMS Stent in the SVG-RCA along with there rest of the graft.  Widely patent SVG-OM and LIMA-LAD.  Normal, preserved  LVEF with normal LVEDP.    Discharge Vitals:  Blood pressure 127/72, pulse 49, temperature 98 F (36.7 C), temperature source Oral, resp. rate 20, height 5\' 10"  (1.778 m), weight 95.3 kg (210 lb 1.6 oz), SpO2 96.00%.  General appearance: alert, cooperative, appears stated age, no distress and very pleasant mood & affect Neck: no adenopathy, no carotid bruit, no JVD, supple, symmetrical, trachea midline and thyroid not enlarged, symmetric, no tenderness/mass/nodules Lungs: clear to auscultation bilaterally, normal percussion bilaterally and non-labored Heart: regular rate and rhythm, S1, S2 normal, no murmur, click, rub or gallop Abdomen: soft, non-tender; bowel sounds normal; no masses,  no organomegaly Extremities: extremities normal, atraumatic, no cyanosis or edema Pulses: 2+ and symmetric; normal reverse Allen's. Neurologic: Grossly normal   Labs: Results for orders placed during the hospital encounter of 12/02/11 (from the past 48 hour(s))  GLUCOSE, CAPILLARY     Status: Abnormal   Collection Time   12/02/11  8:20 AM      Component Value Range Comment   Glucose-Capillary 164 (*) 70 - 99 mg/dL   CBC     Status: Abnormal   Collection Time   12/02/11  8:45 AM      Component Value Range Comment   WBC 7.1  4.0 - 10.5 K/uL    RBC 4.16 (*) 4.22 - 5.81 MIL/uL    Hemoglobin 13.4  13.0 - 17.0 g/dL    HCT 40.9 (*) 81.1 - 52.0 %    MCV 93.3  78.0 -  100.0 fL    MCH 32.2  26.0 - 34.0 pg    MCHC 34.5  30.0 - 36.0 g/dL    RDW 11.9  14.7 - 82.9 %    Platelets 227  150 - 400 K/uL   BASIC METABOLIC PANEL     Status: Abnormal   Collection Time   12/02/11  8:45 AM      Component Value Range Comment   Sodium 135  135 - 145 mEq/L    Potassium 4.1  3.5 - 5.1 mEq/L    Chloride 101  96 - 112 mEq/L    CO2 24  19 - 32 mEq/L    Glucose, Bld 163 (*) 70 - 99 mg/dL    BUN 31 (*) 6 - 23 mg/dL    Creatinine, Ser 5.62  0.50 - 1.35 mg/dL    Calcium 9.1  8.4 - 13.0 mg/dL    GFR calc non Af Amer 53 (*) >90  mL/min    GFR calc Af Amer 61 (*) >90 mL/min   PROTIME-INR     Status: Normal   Collection Time   12/02/11  8:45 AM      Component Value Range Comment   Prothrombin Time 13.7  11.6 - 15.2 seconds    INR 1.03  0.00 - 1.49   GLUCOSE, CAPILLARY     Status: Abnormal   Collection Time   12/02/11  1:38 PM      Component Value Range Comment   Glucose-Capillary 122 (*) 70 - 99 mg/dL   GLUCOSE, CAPILLARY     Status: Abnormal   Collection Time   12/02/11  4:54 PM      Component Value Range Comment   Glucose-Capillary 229 (*) 70 - 99 mg/dL   GLUCOSE, CAPILLARY     Status: Abnormal   Collection Time   12/02/11 10:10 PM      Component Value Range Comment   Glucose-Capillary 187 (*) 70 - 99 mg/dL    Comment 1 Notify RN      Comment 2 Documented in Chart     CBC     Status: Abnormal   Collection Time   12/03/11  4:07 AM      Component Value Range Comment   WBC 8.4  4.0 - 10.5 K/uL    RBC 4.15 (*) 4.22 - 5.81 MIL/uL    Hemoglobin 13.0  13.0 - 17.0 g/dL    HCT 86.5 (*) 78.4 - 52.0 %    MCV 92.5  78.0 - 100.0 fL    MCH 31.3  26.0 - 34.0 pg    MCHC 33.9  30.0 - 36.0 g/dL    RDW 69.6  29.5 - 28.4 %    Platelets 233  150 - 400 K/uL   BASIC METABOLIC PANEL     Status: Abnormal   Collection Time   12/03/11  4:07 AM      Component Value Range Comment   Sodium 138  135 - 145 mEq/L    Potassium 3.7  3.5 - 5.1 mEq/L    Chloride 101  96 - 112 mEq/L    CO2 24  19 - 32 mEq/L    Glucose, Bld 110 (*) 70 - 99 mg/dL    BUN 24 (*) 6 - 23 mg/dL    Creatinine, Ser 1.32  0.50 - 1.35 mg/dL    Calcium 9.1  8.4 - 44.0 mg/dL    GFR calc non Af Amer 54 (*) >90 mL/min    GFR  calc Af Amer 63 (*) >90 mL/min   GLUCOSE, CAPILLARY     Status: Abnormal   Collection Time   12/03/11  8:21 AM      Component Value Range Comment   Glucose-Capillary 144 (*) 70 - 99 mg/dL     Disposition:  Follow-up Information    Follow up with HARDING,DAVID W, MD. (office will call)    Contact information:   [facility] [address] [place] [address] [phone]         Discharge Medications:  Medication List  As of 12/03/2011  9:07 AM   STOP taking these medications         digoxin 0.25 MG tablet         TAKE these medications         acetaminophen 650 MG CR tablet   Commonly known as: TYLENOL   Take 1,300 mg by mouth 2 (two) times daily.      allopurinol 100 MG tablet   Commonly known as: ZYLOPRIM   Take 200 mg by mouth every evening.      aspirin EC 81 MG tablet   Take 81 mg by mouth daily.      atorvastatin 10 MG tablet   Commonly known as: LIPITOR   Take 10 mg by mouth every evening.      Calcium Carbonate-Vit D-Min 600-400 MG-UNIT Tabs   Take 1 tablet by mouth 2 (two) times daily.      carvedilol 3.125 MG tablet   Commonly known as: COREG   Take 3.125 mg by mouth 2 (two) times daily with a meal.      celecoxib 200 MG capsule   Commonly known as: CELEBREX   Take 200 mg by mouth 2 (two) times daily as needed. For back pain      esomeprazole 40 MG capsule   Commonly known as: NEXIUM   Take 40 mg by mouth every evening.      ezetimibe 10 MG tablet   Commonly known as: ZETIA   Take 10 mg by mouth every evening.      finasteride 5 MG tablet   Commonly known as: PROSCAR   Take 5 mg by mouth every evening.      Glucosamine HCl 1500 MG Tabs   Take 1 tablet by mouth every morning.      insulin glargine 100 UNIT/ML injection   Commonly known as: LANTUS   Inject 45 Units into the skin at bedtime.      losartan-hydrochlorothiazide 50-12.5 MG per tablet   Commonly known as: HYZAAR   Take 1 tablet by mouth daily.      nitroGLYCERIN 0.4 MG SL tablet   Commonly known as: NITROSTAT   Place 0.4 mg under the tongue every 5 (five) minutes as needed. For chest pain      polyethylene glycol packet   Commonly known as: MIRALAX / GLYCOLAX   Take 17 g by mouth daily as needed. Constipation      Ticagrelor 90 MG Tabs tablet   Commonly  known as: BRILINTA   Take 1 tablet (90 mg total) by mouth 2 (two) times daily.            Duration of Discharge Encounter: Greater than 30 minutes including physician time.  Signed, Corine Shelter PA-C 12/03/2011 9:07 AM  I have seen and examined the patient this AM.  He is doing wonderful post PCI of Native Diag1.    Feels  better. His wife notes that he has better color.  He is ready for d/c. Radial site looks great.  ROV with extender in 1-2 weeks then with me after.  Dale Haynes, M.D., M.S.  [facility]  [address]  [place], [place] [address]  [phone]  Pager # [phone]  12/03/2011 12:40 PM

## 2011-12-03 NOTE — Care Management Note (Signed)
    Page 1 of 1   12/03/2011     3:27:56 PM   CARE MANAGEMENT NOTE 12/03/2011  Patient:  Dale Haynes, Dale Haynes   Account Number:  [id number]  Date Initiated:  12/03/2011  Documentation initiated by:  Alvira Philips Assessment:   76 yr-old male adm for PTCA; lives with spouse, independent PTA.         DC Planning Services  CM consult  Medication Assistance      Status of service:  Completed, signed off  Discharge Disposition:  HOME/SELF CARE  Comments:  12/03/11 0945 Peyton Rossner RN MSN CCM Pt to d/c on Brilinta, copay will be $17/month @ retail pharmacy.    Pt gets 90-day supply of meds from [place]. Provided card for free 30-day supply, ptwants to use [facility] on [address].  TC to [employer] reveals no Brilinta in stock - [facility] [address] [facility]. Information provided - spouse states she will [facility] and will mail script for 90-day supply to [place].

## 2011-12-03 NOTE — Progress Notes (Signed)
CARDIAC REHAB PHASE I   PRE:  Rate/Rhythm: 74SR  BP:  Supine:   Sitting: 161/73  Standing:    SaO2: 94%RA  MODE:  Ambulation: 680 ft   POST:  Rate/Rhythem: 93SR  BP:  Supine:   Sitting: 170/71  Standing:    SaO2: 98%RA  0840-0930 Pt walked 680 ft on RA with steady gait. Denied chest pain. Education completed. Declined CRP2. Has completed pulmonary and cardiac rehab programs before.  Dale Haynes

## 2012-10-12 ENCOUNTER — Other Ambulatory Visit: Payer: Self-pay | Admitting: *Deleted

## 2012-10-12 MED ORDER — TICAGRELOR 90 MG PO TABS: 90.0000 mg | ORAL_TABLET | Freq: Two times a day (BID) | ORAL | Status: DC

## 2012-10-12 NOTE — Telephone Encounter (Signed)
Refill Brilinta to [facility].  Canceled refil with local pharmacy

## 2012-10-13 ENCOUNTER — Other Ambulatory Visit: Payer: Self-pay | Admitting: *Deleted

## 2012-10-13 MED ORDER — TICAGRELOR 90 MG PO TABS: 90.0000 mg | ORAL_TABLET | Freq: Two times a day (BID) | ORAL | Status: DC

## 2014-05-11 ENCOUNTER — Encounter (HOSPITAL_COMMUNITY): Payer: Self-pay | Admitting: Cardiology

## 2014-12-15 ENCOUNTER — Encounter: Payer: Self-pay | Admitting: *Deleted

## 2015-01-30 ENCOUNTER — Encounter: Payer: Self-pay | Admitting: Cardiology

## 2018-07-01 ENCOUNTER — Telehealth: Payer: Self-pay

## 2018-07-01 NOTE — Telephone Encounter (Signed)
error 

## 2018-07-02 ENCOUNTER — Encounter: Payer: Self-pay | Admitting: Cardiology

## 2018-07-02 ENCOUNTER — Ambulatory Visit (INDEPENDENT_AMBULATORY_CARE_PROVIDER_SITE_OTHER): Payer: Non-veteran care | Admitting: Cardiology

## 2018-07-02 VITALS — BP 114/74 | HR 59 | Ht 70.0 in | Wt 252.6 lb

## 2018-07-02 DIAGNOSIS — I2510 Atherosclerotic heart disease of native coronary artery without angina pectoris: Secondary | ICD-10-CM | POA: Diagnosis not present

## 2018-07-02 DIAGNOSIS — Z9861 Coronary angioplasty status: Secondary | ICD-10-CM

## 2018-07-02 DIAGNOSIS — Z87898 Personal history of other specified conditions: Secondary | ICD-10-CM | POA: Insufficient documentation

## 2018-07-02 DIAGNOSIS — Z7189 Other specified counseling: Secondary | ICD-10-CM

## 2018-07-02 DIAGNOSIS — E785 Hyperlipidemia, unspecified: Secondary | ICD-10-CM | POA: Diagnosis not present

## 2018-07-02 DIAGNOSIS — I10 Essential (primary) hypertension: Secondary | ICD-10-CM

## 2018-07-02 DIAGNOSIS — Z8679 Personal history of other diseases of the circulatory system: Secondary | ICD-10-CM

## 2018-07-02 NOTE — Progress Notes (Signed)
Cardiology Office Note:    Date:  07/02/2018   ID:  JOSEJULIAN TARANGO, DOB [birth year], MRN [id number]  PCP:  Dr. Girtha Hake  Cardiologist:  Buford Dresser, MD PhD  Referring MD: [facility], Dr. Girtha Hake  CC: re-establish care, last seen by Dr. Ellyn Hack in 09/20/11.  History of Present Illness:    Dale Haynes is a 83 y.o. male with a hx of CAD s/p CABG in 09-20-94 as well as PCI, type II diabetes, hypertension, bradycardia, hyperlipidemia who is seen as a new consult at the request of Dr. Shirleen Schirmer for the evaluation and management of CAD. He had been followed through community care by [facility].  Records received from [facility], Dr. Lilyan Punt Sinno: Per recent note, no active chest pain or concerns. Has noted increase in LE edema. Former smoker, quit 25 years ago. No alcohol. Served in [employer] for 24 years. Wife passed away in 2013/09/19.  Lab work: Cr 1.76 (per notes, this is elevated for him, may be due to increased lasix use). Potassium 4.2. HDL 47, LDL 89, Tchol 163, TG 137, A1c 7.6 No echo/other cardiac testing included.  Today: overall feels that he is doing well. Tries to walk, but walked one lap at the mall and had hip pain. Used to go to the senior center for activity, but it is a bit far from him. A new one is opening nearby, plans to attend. No regular chest pain, has only rarely taken nitro, hasn't taken in at least 2 mos. No syncope. No shortness of breath at rest, but has some dyspnea on exertion but it is not limiting for ADLs (like walking to the mailbox). Has chronic LE edema since 09-20-1994, has stayed stable. Controlled with diuretics and compression pump system through [place], wears compression stockings.   Last echo in [place] was 09/2017 at [facility]. Was told it was normal.   Past Medical History:  Diagnosis Date  . Anginal pain ([place])   . Arthritis    "neck; hands"  . BPH (benign prostatic hyperplasia)   . Bursitis of shoulder,  right    "I've taken alot of shots for that"  . CAD S/P percutaneous coronary angioplasty 09/20/1994 CABG, September 19, 2008 PCII   s/ CABG & PCI  (SVG-RCA 80% lesion stented  BMS 4.0 mm x 18 mm, post-dilated to ~4.6 mm)  . Exertional dyspnea   . GERD (gastroesophageal reflux disease)   . Gout   . Gout of big toe    "left"  . Headache(784.0)    "not a routine thing"  . Hyperlipidemia LDL goal < 70   . Hypertension associated with diabetes ([place])   . Kidney stones    "lots"  . Paroxysmal atrial fibrillation ([facility])   . Skin cancer    "face, ears, head, arms"    Past Surgical History:  Procedure Laterality Date  . APPENDECTOMY  1959  . CHOLECYSTECTOMY  1980's  . CORONARY ANGIOPLASTY WITH STENT PLACEMENT  09-19-2008; 12/02/2011   "1 + 1; 2 total"  . CORONARY ARTERY BYPASS GRAFT  1996   LIMA-LAD, SVG-DIAG, SVG-OM, SVG-RCA  . [place]; [id number]   right; left  . LEFT HEART CATHETERIZATION WITH CORONARY/GRAFT ANGIOGRAM N/A 12/02/2011   Procedure: LEFT HEART CATHETERIZATION WITH Beatrix Fetters;  Surgeon: Leonie Man, MD;  Location: [facility] CATH LAB;  Service: Cardiovascular;  Laterality: N/A;  . SKIN CANCER EXCISION     multiple    Current Medications: Current Outpatient  Medications on File Prior to Visit  Medication Sig  . acetaminophen (TYLENOL) 650 MG CR tablet Take 1,300 mg by mouth 2 (two) times daily.  Marland Kitchen allopurinol (ZYLOPRIM) 100 MG tablet Take 200 mg by mouth every evening.  . Alogliptin Benzoate 12.5 MG TABS Take by mouth.  Marland Kitchen aspirin EC 81 MG tablet Take 81 mg by mouth daily.  Marland Kitchen atorvastatin (LIPITOR) 10 MG tablet Take 10 mg by mouth every evening.  . Calcium Carbonate-Vit D-Min 600-400 MG-UNIT TABS Take 1 tablet by mouth 2 (two) times daily.  . carvedilol (COREG) 3.125 MG tablet Take 3.125 mg by mouth 2 (two) times daily with a meal.  . doxazosin (CARDURA) 4 MG tablet Take 4 mg by mouth daily.  . Glucosamine HCl 1500 MG TABS Take 1 tablet by mouth every morning.  .  hydrochlorothiazide (MICROZIDE) 12.5 MG capsule Take 12.5 mg by mouth daily.  . insulin glargine (LANTUS) 100 UNIT/ML injection Inject 45 Units into the skin at bedtime.  Marland Kitchen losartan-hydrochlorothiazide (HYZAAR) 50-12.5 MG per tablet Take 1 tablet by mouth daily.  . nitroGLYCERIN (NITROSTAT) 0.4 MG SL tablet Place 0.4 mg under the tongue every 5 (five) minutes as needed. For chest pain  . polyethylene glycol (MIRALAX / GLYCOLAX) packet Take 17 g by mouth daily as needed. Constipation  . ranitidine (ZANTAC) 150 MG capsule Take 150 mg by mouth 2 (two) times daily.   No current facility-administered medications on file prior to visit.      Allergies:   Statins and Metoprolol   Social History   Socioeconomic History  . Marital status: Married    Spouse name: Not on file  . Number of children: Not on file  . Years of education: Not on file  . Highest education level: Not on file  Occupational History  . Not on file  Social Needs  . Financial resource strain: Not on file  . Food insecurity:    Worry: Not on file    Inability: Not on file  . Transportation needs:    Medical: Not on file    Non-medical: Not on file  Tobacco Use  . Smoking status: Former Smoker    Years: 30.00    Types: Pipe, Cigars    Last attempt to quit: 12/01/1992    Years since quitting: 25.6  . Smokeless tobacco: Never Used  Substance and Sexual Activity  . Alcohol use: No  . Drug use: No  . Sexual activity: Not Currently  Lifestyle  . Physical activity:    Days per week: Not on file    Minutes per session: Not on file  . Stress: Not on file  Relationships  . Social connections:    Talks on phone: Not on file    Gets together: Not on file    Attends religious service: Not on file    Active member of club or organization: Not on file    Attends meetings of clubs or organizations: Not on file    Relationship status: Not on file  Other Topics Concern  . Not on file  Social History Narrative   He is  retired from [employer], having served 26 years.     Married.     Family History: The patient's family history includes Asthma in his father; COPD in his father; Cancer in his brother and sister; Coronary artery disease in an other family member; Heart disease in his brother and father; High blood pressure in his father; Nephrolithiasis in his sister.  ROS:   Please see the history of present illness.  Additional pertinent ROS:  Constitutional: Negative for chills, fever, night sweats, unintentional weight loss  HENT: Negative for ear pain and hearing loss.   Eyes: Negative for loss of vision and eye pain.  Respiratory: Negative for cough, sputum, shortness of breath, wheezing.   Cardiovascular: Negative for chest pain, palpitations, PND, orthopnea, lower extremity edema and claudication.  Gastrointestinal: Negative for abdominal pain, melena, and hematochezia.  Genitourinary: Negative for dysuria and hematuria.  Musculoskeletal: Negative for falls and myalgias. Positive for joint pain.  Skin: Negative for itching and rash.  Neurological: Negative for focal weakness, focal sensory changes and loss of consciousness.  Endo/Heme/Allergies: Does not bruise/bleed easily.    EKGs/Labs/Other Studies Reviewed:    The following studies were reviewed today: Notes received from [place]  Cath 2013:  Recent 100% occlusion of the SVG-Diag1 as the likely culprit for recent unstable angina.   Status/Post Successful PCI of theproximal Native Diag1 lesion with a Promus Element DES 2.5 mm x 12 mm, post-dilated to 2.75 mm.   Widely patent BMS Stent in the SVG-RCA along with there rest of the graft.   Widely patent SVG-OM and LIMA-LAD.   Normal, preserved LVEF with normal LVEDP.  EKG:  EKG is personally reviewed.  The ekg ordered today demonstrates sinus bradycardia at a rate of 59 bpm.  Recent Labs: No results found for requested labs within last 8760 hours.  Recent Lipid Panel    Component Value  Date/Time   CHOL  10/31/2008 0350    110        ATP III CLASSIFICATION:  <200     mg/dL   Desirable  200-239  mg/dL   Borderline High  >=240    mg/dL   High          TRIG 43 10/31/2008 0350   HDL 43 10/31/2008 0350   CHOLHDL 2.6 10/31/2008 0350   VLDL 9 10/31/2008 0350   LDLCALC  10/31/2008 0350    58        Total Cholesterol/HDL:CHD Risk Coronary Heart Disease Risk Table                     Men   Women  1/2 Average Risk   3.4   3.3  Average Risk       5.0   4.4  2 X Average Risk   9.6   7.1  3 X Average Risk  23.4   11.0        Use the calculated Patient Ratio above and the CHD Risk Table to determine the patient's CHD Risk.        ATP III CLASSIFICATION (LDL):  <100     mg/dL   Optimal  100-129  mg/dL   Near or Above                    Optimal  130-159  mg/dL   Borderline  160-189  mg/dL   High  >190     mg/dL   Very High    Physical Exam:    VS:  BP 114/74   Pulse (!) 59   Ht 5\' 10"  (1.778 m)   Wt 252 lb 9.6 oz (114.6 kg)   BMI 36.24 kg/m     Wt Readings from Last 3 Encounters:  07/02/18 252 lb 9.6 oz (114.6 kg)  12/03/11 210 lb 1.6 oz (95.3 kg)     GEN: Well  nourished, well developed in no acute distress HEENT: Normal NECK: No JVD; No carotid bruits LYMPHATICS: No lymphadenopathy CARDIAC: regular rhythm, normal S1 and S2, no murmurs, rubs, gallops. Radial and DP pulses 2+ bilaterally. RESPIRATORY:  Clear to auscultation without rales, wheezing or rhonchi  ABDOMEN: Soft, non-tender, non-distended MUSCULOSKELETAL:  No edema; No deformity  SKIN: Warm and dry NEUROLOGIC:  Alert and oriented x 3 PSYCHIATRIC:  Normal affect   ASSESSMENT:    1. Coronary artery disease involving native coronary artery of native heart without angina pectoris   2. CABG '96, SVG-RCA BMS 2010, DES to native Dx this 12/02/11   3. Hyperlipidemia LDL goal <70   4. Essential hypertension   5. History of bradycardia    PLAN:    1. CAD without angina, s/p CABG 1996, PCI 2010,  PCI 2013: no recent angina, tolerating medications -continue aspirin 81 mg -continue atorvastatin 10 mg (cannot tolerate higher doses) -continue carvedilol 3.125 mg BID. Cannot tolerate higher doses due to bradycardia  2. Hyperlipidemia: atorvastatin as above.   3. Hypertension  -continue HCTZ, carvedilol, losartan  -pending renal appt for CKD, will need to determine if losartan-HCTZ needs to be stopped or adjusted based on renal function   4. History of bradycardia: did not tolerate metoprolol. No room to titrate carvedilol.  5. Secondary prevention -recommend heart healthy/Mediterranean diet, with whole grains, fruits, vegetable, fish, lean meats, nuts, and olive oil. Limit salt. -recommend moderate walking, 3-5 times/week for 30-50 minutes each session. Aim for at least 150 minutes.week. Goal should be pace of 3 miles/hours, or walking 1.5 miles in 30 minutes -recommend avoidance of tobacco products. Avoid excess alcohol.  Plan for follow up: 9 mos  Medication Adjustments/Labs and Tests Ordered: Current medicines are reviewed at length with the patient today.  Concerns regarding medicines are outlined above.  Orders Placed This Encounter  Procedures  . EKG 12-Lead   No orders of the defined types were placed in this encounter.   Patient Instructions  Medication Instructions:  Your Physician recommend you continue on your current medication as directed.    If you need a refill on your cardiac medications before your next appointment, please call your pharmacy.   Lab work: None  Testing/Procedures: None  Follow-Up: At [facility], you and your health needs are our priority.  As part of our continuing mission to provide you with exceptional heart care, we have created designated Provider Care Teams.  These Care Teams include your primary Cardiologist (physician) and Advanced Practice Providers (APPs -  Physician Assistants and Nurse Practitioners) who all work together to  provide you with the care you need, when you need it. You will need a follow up appointment in 9 months.  Please call our office 2 months in advance to schedule this appointment.  You may see Buford Dresser, MD or one of the following Advanced Practice Providers on your designated Care Team:   Rosaria Ferries, PA-C . Jory Sims, DNP, ANP       Signed, Buford Dresser, MD PhD 07/02/2018 2:09 PM    [place]

## 2018-07-02 NOTE — Patient Instructions (Signed)

## 2019-03-29 ENCOUNTER — Encounter: Payer: Non-veteran care | Admitting: Cardiology

## 2019-03-30 ENCOUNTER — Encounter: Payer: Self-pay | Admitting: Cardiology

## 2019-03-30 ENCOUNTER — Other Ambulatory Visit: Payer: Self-pay

## 2019-03-30 ENCOUNTER — Ambulatory Visit (INDEPENDENT_AMBULATORY_CARE_PROVIDER_SITE_OTHER): Payer: Medicare Other | Admitting: Cardiology

## 2019-03-30 VITALS — BP 132/73 | HR 69 | Ht 70.0 in | Wt 242.0 lb

## 2019-03-30 DIAGNOSIS — E785 Hyperlipidemia, unspecified: Secondary | ICD-10-CM | POA: Diagnosis not present

## 2019-03-30 DIAGNOSIS — Z9861 Coronary angioplasty status: Secondary | ICD-10-CM | POA: Diagnosis not present

## 2019-03-30 DIAGNOSIS — Z951 Presence of aortocoronary bypass graft: Secondary | ICD-10-CM

## 2019-03-30 DIAGNOSIS — I2510 Atherosclerotic heart disease of native coronary artery without angina pectoris: Secondary | ICD-10-CM | POA: Diagnosis not present

## 2019-03-30 DIAGNOSIS — I10 Essential (primary) hypertension: Secondary | ICD-10-CM

## 2019-03-30 DIAGNOSIS — Z87898 Personal history of other specified conditions: Secondary | ICD-10-CM

## 2019-03-30 DIAGNOSIS — Z7189 Other specified counseling: Secondary | ICD-10-CM

## 2019-03-30 NOTE — Progress Notes (Signed)
Cardiology Office Note:    Date:  03/30/2019   ID:  Dale Haynes, DOB Oct 08, 1935, MRN [id number]  PCP:  Dr. Girtha Hake  Cardiologist:  Buford Dresser, MD PhD  Referring MD: [facility], Dr. Girtha Hake  CC: follow up  History of Present Illness:    Dale Haynes is a 82 y.o. male with a hx of CAD s/p CABG in 1996 as well as PCI, type II diabetes, hypertension, bradycardia, hyperlipidemia who is seen for follow up today. I initially saw him as a new consult at the request of Dr. Shirleen Schirmer for the evaluation and management of CAD on 07/01/18. He had been followed through community care by [facility] most recently prior to this, remotely by Dr. Ellyn Hack (last 2013). [facility].  Today: Staying safe from coronavirus. Had an episode several months ago with chest pain, didn't seek medical attention. Lasted about an hour. Noted tightness across chest and down left arm. Took nitro, did not seem to help. Was sitting in his recliner watching TV at the time. No further occurrences.   Lives alone, has been isolated to avoid covid exposure. Cleans house, walks around, but no intentional exercise usually. Can garden, once walked 2 miles without issue.   Always feels short of breath, this is unchanged. Mild, notices more when wearing mask, both worse with exertion but also nonexertional. LE edema stable, still wearing compression stockings/using pump. LE edema well controlled. Lost 20 lbs since being at home, eating is much better since not going out to eat.   Brings labs from [place]: 12/14/18 Tchol 157, TG 163, HDL 52, LDL 72  CBC normal  BMET Cr 1.72 LFTs, TSH normal. Mg 1.7, given supplement  Home BP logs: Range 95/65-133/74, most 110s-120s/70s. HR 58-76, most 60.   Went to nephrologist, told he was doing fine for his age, no change to medications. Has furosemide PRN  Denies additional chest pain. No PND, orthopnea, or unexpected  weight gain. No syncope or palpitations.  Past Medical History:  Diagnosis Date   Anginal pain ([place])    Arthritis    "neck; hands"   BPH (benign prostatic hyperplasia)    Bursitis of shoulder, right    "I've taken alot of shots for that"   CAD S/P percutaneous coronary angioplasty 1996 CABG, 2010 PCII   s/ CABG & PCI  (SVG-RCA 80% lesion stented  BMS 4.0 mm x 18 mm, post-dilated to ~4.6 mm)   Exertional dyspnea    GERD (gastroesophageal reflux disease)    Gout    Gout of big toe    "left"   Headache(784.0)    "not a routine thing"   Hyperlipidemia LDL goal < 70    Hypertension associated with diabetes ([place])    Kidney stones    "lots"   Paroxysmal atrial fibrillation ([place])    Skin cancer    "face, ears, head, arms"    Past Surgical History:  Procedure Laterality Date   APPENDECTOMY  1959   CHOLECYSTECTOMY  1980's   [place]  2010; 12/02/2011   "1 + 1; 2 total"   CORONARY ARTERY BYPASS GRAFT  1996   LIMA-LAD, SVG-DIAG, SVG-OM, [place]; ~1963   right; left   LEFT HEART CATHETERIZATION WITH CORONARY/GRAFT ANGIOGRAM N/A 12/02/2011   Procedure: LEFT HEART CATHETERIZATION WITH Beatrix Fetters;  Surgeon: Leonie Man, MD;  Location: [facility] CATH LAB;  Service: Cardiovascular;  Laterality:  N/A;   SKIN CANCER EXCISION     multiple    Current Medications: Current Outpatient Medications on File Prior to Visit  Medication Sig   acetaminophen (TYLENOL) 650 MG CR tablet Take 1,300 mg by mouth 2 (two) times daily.   allopurinol (ZYLOPRIM) 100 MG tablet Take 150 mg by mouth every evening.   Alogliptin Benzoate 12.5 MG TABS Take by mouth.   amLODipine (NORVASC) 5 MG tablet Take 5 mg by mouth daily.   aspirin EC 81 MG tablet Take 81 mg by mouth daily.   atorvastatin (LIPITOR) 10 MG tablet Take 10 mg by mouth every evening.   Calcium Carbonate-Vit D-Min 600-400 MG-UNIT TABS Take 1 tablet  by mouth 2 (two) times daily.   carvedilol (COREG) 3.125 MG tablet Take 12.5 mg by mouth 2 (two) times daily with a meal.   doxazosin (CARDURA) 4 MG tablet Take 2 mg by mouth daily.   famotidine (PEPCID) 20 MG tablet Take 20 mg by mouth 2 (two) times daily.   finasteride (PROSCAR) 5 MG tablet Take 5 mg by mouth daily.   furosemide (LASIX) 20 MG tablet Take 20 mg by mouth once as needed. fluid   Glucosamine HCl 1500 MG TABS Take 1 tablet by mouth every morning.   guaifenesin (HUMIBID E) 400 MG TABS tablet Take 400 mg by mouth every 4 (four) hours.   hydrochlorothiazide (HYDRODIURIL) 25 MG tablet Take 12.5 mg by mouth daily.   insulin glargine (LANTUS) 100 UNIT/ML injection Inject 30 Units into the skin at bedtime. Based on blood sugar   losartan (COZAAR) 50 MG tablet Take 25 mg by mouth daily.   Magnesium Oxide 420 (252 Mg) MG TABS Take 1 tablet by mouth.   nitroGLYCERIN (NITROSTAT) 0.4 MG SL tablet Place 0.4 mg under the tongue every 5 (five) minutes as needed. For chest pain   polyethylene glycol (MIRALAX / GLYCOLAX) packet Take 17 g by mouth daily as needed. Constipation   No current facility-administered medications on file prior to visit.      Allergies:   Statins and Metoprolol   Social History   Socioeconomic History   Marital status: Married    Spouse name: Not on file   Number of children: Not on file   Years of education: Not on file   Highest education level: Not on file  Occupational History   Not on file  Social Needs   Financial resource strain: Not on file   Food insecurity    Worry: Not on file    Inability: Not on file   Transportation needs    Medical: Not on file    Non-medical: Not on file  Tobacco Use   Smoking status: Former Smoker    Years: 30.00    Types: Pipe, Cigars    Quit date: 12/01/1992    Years since quitting: 26.3   Smokeless tobacco: Never Used  Substance and Sexual Activity   Alcohol use: No   Drug use: No    Sexual activity: Not Currently  Lifestyle   Physical activity    Days per week: Not on file    Minutes per session: Not on file   Stress: Not on file  Relationships   Social connections    Talks on phone: Not on file    Gets together: Not on file    Attends religious service: Not on file    Active member of club or organization: Not on file    Attends meetings of clubs or  organizations: Not on file    Relationship status: Not on file  Other Topics Concern   Not on file  Social History Narrative   He is retired from [employer], having served 26 years.     Married.     Family History: The patient's family history includes Asthma in his father; COPD in his father; Cancer in his brother and sister; Coronary artery disease in an other family member; Heart disease in his brother and father; High blood pressure in his father; Nephrolithiasis in his sister.  ROS:   Please see the history of present illness.  Additional pertinent ROS: Constitutional: Negative for chills, fever, night sweats, unintentional weight loss  HENT: Negative for ear pain and hearing loss.   Eyes: Negative for loss of vision and eye pain.  Respiratory: Negative for cough, sputum, wheezing.   Cardiovascular: See HPI. Gastrointestinal: Negative for abdominal pain, melena, and hematochezia.  Genitourinary: Negative for dysuria and hematuria.  Musculoskeletal: Negative for falls and myalgias.  Skin: Negative for itching and rash.  Neurological: Negative for focal weakness, focal sensory changes and loss of consciousness.  Endo/Heme/Allergies: Does not bruise/bleed easily.    EKGs/Labs/Other Studies Reviewed:    The following studies were reviewed today: Notes received from [place]  Cath 2013:  Recent 100% occlusion of the SVG-Diag1 as the likely culprit for recent unstable angina.   Status/Post Successful PCI of theproximal Native Diag1 lesion with a Promus Element DES 2.5 mm x 12 mm, post-dilated to 2.75 mm.     Widely patent BMS Stent in the SVG-RCA along with there rest of the graft.   Widely patent SVG-OM and LIMA-LAD.   Normal, preserved LVEF with normal LVEDP.  EKG:  EKG is personally reviewed.  The ekg ordered 07/02/18 demonstrates sinus bradycardia at a rate of 59 bpm.  Recent Labs: No results found for requested labs within last 8760 hours.  Recent Lipid Panel    Component Value Date/Time   CHOL  10/31/2008 0350    110        ATP III CLASSIFICATION:  <200     mg/dL   Desirable  200-239  mg/dL   Borderline High  >=240    mg/dL   High          TRIG 43 10/31/2008 0350   HDL 43 10/31/2008 0350   CHOLHDL 2.6 10/31/2008 0350   VLDL 9 10/31/2008 0350   LDLCALC  10/31/2008 0350    58        Total Cholesterol/HDL:CHD Risk Coronary Heart Disease Risk Table                     Men   Women  1/2 Average Risk   3.4   3.3  Average Risk       5.0   4.4  2 X Average Risk   9.6   7.1  3 X Average Risk  23.4   11.0        Use the calculated Patient Ratio above and the CHD Risk Table to determine the patient's CHD Risk.        ATP III CLASSIFICATION (LDL):  <100     mg/dL   Optimal  100-129  mg/dL   Near or Above                    Optimal  130-159  mg/dL   Borderline  160-189  mg/dL   High  >190  mg/dL   Very High    Physical Exam:    VS:  BP 132/73    Pulse 69    Ht 5\' 10"  (1.778 m)    Wt 242 lb (109.8 kg)    BMI 34.72 kg/m     Wt Readings from Last 3 Encounters:  03/30/19 242 lb (109.8 kg)  07/02/18 252 lb 9.6 oz (114.6 kg)  12/03/11 210 lb 1.6 oz (95.3 kg)    GEN: Well nourished, well developed in no acute distress HEENT: Normal, moist mucous membranes NECK: No JVD CARDIAC: regular rhythm, normal S1 and S2, no rubs or gallops. No murmurs. VASCULAR: Radial and DP pulses 2+ bilaterally. No carotid bruits RESPIRATORY:  Clear to auscultation without rales, wheezing or rhonchi  ABDOMEN: Soft, non-tender, non-distended MUSCULOSKELETAL:  Ambulates independently SKIN:  Warm and dry, no edema NEUROLOGIC:  Alert and oriented x 3. No focal neuro deficits noted. PSYCHIATRIC:  Normal affect   ASSESSMENT:    1. Coronary artery disease involving native heart without angina pectoris, unspecified vessel or lesion type   2. History of coronary artery bypass graft   3. Hyperlipidemia LDL goal <70   4. Essential hypertension   5. History of bradycardia   6. Cardiac risk counseling   7. Counseling on health promotion and disease prevention    PLAN:    CAD without angina, s/p CABG 1996, PCI 2010, PCI 2013: has an episode of chest pain since last visit but did not seek medical attention. None since -continue aspirin 81 mg -continue atorvastatin 10 mg (cannot tolerate higher doses) -continue carvedilol 3.125 mg BID. Cannot tolerate higher doses due to bradycardia  Hyperlipidemia: -atorvastatin 10 mg, cannot tolerate higher doses  Hypertension: did extensive medication reconciliation today with his medication bottles -continue amlodipine 5 mg daily, HCTZ 25 mg daily, carvedilol as above, losartan 25 mg daily -on doxazosin as well, not ideal for hypertension but tolerating. Watch for orthostatic symptoms. He is not sure exactly why this was started   History of bradycardia: did not tolerate metoprolol. No room to titrate carvedilol.  Type II diabetes with obesity -on lantus insulin -on alogliptin (DPP4i). Given history of CAD, would recommend SGLT2i or GLP1RA, will defer to endocrinologist  Secondary prevention -recommend heart healthy/Mediterranean diet, with whole grains, fruits, vegetable, fish, lean meats, nuts, and olive oil. Limit salt. -recommend moderate walking, 3-5 times/week for 30-50 minutes each session. Aim for at least 150 minutes.week. Goal should be pace of 3 miles/hours, or walking 1.5 miles in 30 minutes -recommend avoidance of tobacco products. Avoid excess alcohol.  Plan for follow up: 1 year or sooner PRN  TIME SPENT WITH PATIENT: 21  minutes of direct patient care. More than 50% of that time was spent on coordination of care and counseling regarding medication reconciliation, discussion of what meds are/what they treat.  Buford Dresser, MD, PhD [place]   [facility]   Medication Adjustments/Labs and Tests Ordered: Current medicines are reviewed at length with the patient today.  Concerns regarding medicines are outlined above.  No orders of the defined types were placed in this encounter.  No orders of the defined types were placed in this encounter.   Patient Instructions  Medication Instructions:  Your physician recommends that you continue on your current medications as directed. Please refer to the Current Medication list given to you today.  If you need a refill on your cardiac medications before your next appointment, please call your pharmacy.   Lab work: [employer]  Testing/Procedures: NONE  Follow-Up: At [facility], you and your health needs are our priority.  As part of our continuing mission to provide you with exceptional heart care, we have created designated Provider Care Teams.  These Care Teams include your primary Cardiologist (physician) and Advanced Practice Providers (APPs -  Physician Assistants and Nurse Practitioners) who all work together to provide you with the care you need, when you need it. You may see Buford Dresser, MD or one of the following Advanced Practice Providers on your designated Care Team:    Rosaria Ferries, PA-C  Jory Sims, DNP, ANP  Cadence Kathlen Mody, NP Your physician wants you to follow-up in: 1 year. You will receive a reminder letter in the mail two months in advance. If you don't receive a letter, please call our office to schedule the follow-up appointment.        Signed, Buford Dresser, MD PhD 03/30/2019 5:49 PM    [place]

## 2019-03-30 NOTE — Patient Instructions (Signed)
Medication Instructions:  Your physician recommends that you continue on your current medications as directed. Please refer to the Current Medication list given to you today.  If you need a refill on your cardiac medications before your next appointment, please call your pharmacy.   Lab work: NONE  Testing/Procedures: NONE  Follow-Up: At [facility], you and your health needs are our priority.  As part of our continuing mission to provide you with exceptional heart care, we have created designated Provider Care Teams.  These Care Teams include your primary Cardiologist (physician) and Advanced Practice Providers (APPs -  Physician Assistants and Nurse Practitioners) who all work together to provide you with the care you need, when you need it. You may see Buford Dresser, MD or one of the following Advanced Practice Providers on your designated Care Team:    Rosaria Ferries, PA-C  Jory Sims, DNP, ANP  Cadence Kathlen Mody, NP Your physician wants you to follow-up in: 1 year. You will receive a reminder letter in the mail two months in advance. If you don't receive a letter, please call our office to schedule the follow-up appointment.

## 2019-04-03 DIAGNOSIS — Z951 Presence of aortocoronary bypass graft: Secondary | ICD-10-CM | POA: Insufficient documentation

## 2019-08-29 ENCOUNTER — Ambulatory Visit (INDEPENDENT_AMBULATORY_CARE_PROVIDER_SITE_OTHER): Payer: Medicare Other | Admitting: Cardiology

## 2019-08-29 ENCOUNTER — Encounter: Payer: Self-pay | Admitting: Cardiology

## 2019-08-29 ENCOUNTER — Telehealth: Payer: Self-pay | Admitting: Cardiology

## 2019-08-29 ENCOUNTER — Other Ambulatory Visit: Payer: Self-pay

## 2019-08-29 VITALS — BP 118/74 | HR 53 | Ht 70.0 in | Wt 241.6 lb

## 2019-08-29 DIAGNOSIS — R0609 Other forms of dyspnea: Secondary | ICD-10-CM

## 2019-08-29 DIAGNOSIS — Z87898 Personal history of other specified conditions: Secondary | ICD-10-CM

## 2019-08-29 DIAGNOSIS — E1169 Type 2 diabetes mellitus with other specified complication: Secondary | ICD-10-CM

## 2019-08-29 DIAGNOSIS — R079 Chest pain, unspecified: Secondary | ICD-10-CM

## 2019-08-29 DIAGNOSIS — I25118 Atherosclerotic heart disease of native coronary artery with other forms of angina pectoris: Secondary | ICD-10-CM

## 2019-08-29 DIAGNOSIS — Z7189 Other specified counseling: Secondary | ICD-10-CM

## 2019-08-29 DIAGNOSIS — E669 Obesity, unspecified: Secondary | ICD-10-CM

## 2019-08-29 DIAGNOSIS — E785 Hyperlipidemia, unspecified: Secondary | ICD-10-CM | POA: Diagnosis not present

## 2019-08-29 DIAGNOSIS — R0600 Dyspnea, unspecified: Secondary | ICD-10-CM | POA: Diagnosis not present

## 2019-08-29 NOTE — Telephone Encounter (Signed)
Contacted patient- he states that he was taking Doxazosin and his AVS states it was Cardura, I advised with patient that these are the same medications, and it gave [place] both names- patient verbalized understanding. Thankful for call back.

## 2019-08-29 NOTE — Patient Instructions (Addendum)
Medication Instructions:  Stop Cardura 4 mg daily  *If you need a refill on your cardiac medications before your next appointment, please call your pharmacy*   Lab Work: None  Testing/Procedures: Your physician has requested that you have an echocardiogram. Echocardiography is a painless test that uses sound waves to create images of your heart. It provides your doctor with information about the size and shape of your heart and how well your heart's chambers and valves are working. This procedure takes approximately one hour. There are no restrictions for this procedure. [place] [address]    Follow-Up: At [facility], you and your health needs are our priority.  As part of our continuing mission to provide you with exceptional heart care, we have created designated Provider Care Teams.  These Care Teams include your primary Cardiologist (physician) and Advanced Practice Providers (APPs -  Physician Assistants and Nurse Practitioners) who all work together to provide you with the care you need, when you need it.  We recommend signing up for the patient portal called "MyChart".  Sign up information is provided on this After Visit Summary.  MyChart is used to connect with patients for Virtual Visits (Telemedicine).  Patients are able to view lab/test results, encounter notes, upcoming appointments, etc.  Non-urgent messages can be sent to your provider as well.   To learn more about what you can do with MyChart, go to [email or web address].    Your next appointment:   3 month(s)  The format for your next appointment:   In Person  Provider:   Buford Dresser, MD   Other Instructions When we call with ECHO results, please provide blood pressure readings.

## 2019-08-29 NOTE — Progress Notes (Signed)
Cardiology Office Note:    Date:  08/29/2019   ID:  Dale Haynes, DOB Mar 26, 1936, MRN [id number]  PCP:  Dr. Girtha Hake  Cardiologist:  Buford Dresser, MD PhD  Referring MD: [facility], Dr. Girtha Hake  CC: follow up  History of Present Illness:    Dale Haynes is a 84 y.o. male with a hx of CAD s/p CABG in 1996 as well as PCI, type II diabetes, hypertension, bradycardia, hyperlipidemia who is seen for follow up today. I initially saw him as a new consult at the request of Dr. Shirleen Schirmer for the evaluation and management of CAD on 07/01/18. He had been followed through community care by [facility] most recently prior to this, remotely by Dr. Ellyn Hack (last 2013). [facility].  Today: He has been having 5-6 weeks of left chest, left arm discomfort/numbness, relieved by nitro. Similar to symptoms prior to his CABG. Starts in low left chest, moves up to arm. Happening 4-5 times in last 6 weeks. Nonexertional. No associated shortness of breath, nausea, or diaphoresis. Instantly relieved by nitroglycerin.   Always short of breath with exertion. Walking 2-3,000 steps/day.   Has chronic mild LE edema, takes PRN lasix 1-2x/week for worsening swelling.   Checks BP at home, runs as low as 0000000 systolic last week. No elevated blood pressures. Feels occasionally lightheaded.   Brings labs from the [place]: 06/06/2019 Lipids: Tchol 148, TG 164, HDL 53, LDL 63 CBC WNL BMET Cr 1.69, K 4.7 LFTs WNL A1c 6.6 TSH 1.12  Denies shortness of breath at rest. No PND, orthopnea, or unexpected weight gain. No syncope or palpitations. Chronic LE edema unchanged. Chest discomfort/shortness of breath as above.   Past Medical History:  Diagnosis Date  . Anginal pain ([place])   . Arthritis    "neck; hands"  . BPH (benign prostatic hyperplasia)   . Bursitis of shoulder, right    "I've taken alot of shots for that"  . CAD S/P percutaneous coronary  angioplasty 1996 CABG, 2010 PCII   s/ CABG & PCI  (SVG-RCA 80% lesion stented  BMS 4.0 mm x 18 mm, post-dilated to ~4.6 mm)  . Exertional dyspnea   . GERD (gastroesophageal reflux disease)   . Gout   . Gout of big toe    "left"  . Headache(784.0)    "not a routine thing"  . Hyperlipidemia LDL goal < 70   . Hypertension associated with diabetes ([place])   . Kidney stones    "lots"  . Paroxysmal atrial fibrillation (HCC)   . Skin cancer    "face, ears, head, arms"    Past Surgical History:  Procedure Laterality Date  . APPENDECTOMY  1959  . CHOLECYSTECTOMY  1980's  . CORONARY ANGIOPLASTY WITH STENT PLACEMENT  2010; 12/02/2011   "1 + 1; 2 total"  . CORONARY ARTERY BYPASS GRAFT  1996   LIMA-LAD, SVG-DIAG, SVG-OM, SVG-RCA  . [place]; [phone]   right; left  . LEFT HEART CATHETERIZATION WITH CORONARY/GRAFT ANGIOGRAM N/A 12/02/2011   Procedure: LEFT HEART CATHETERIZATION WITH Beatrix Fetters;  Surgeon: Leonie Man, MD;  Location: [facility] CATH LAB;  Service: Cardiovascular;  Laterality: N/A;  . SKIN CANCER EXCISION     multiple    Current Medications: Current Outpatient Medications on File Prior to Visit  Medication Sig  . acetaminophen (TYLENOL) 500 MG tablet   . acetaminophen (TYLENOL) 650 MG CR tablet Take 650 mg by mouth every 8 (  eight) hours as needed for pain.  Marland Kitchen allopurinol (ZYLOPRIM) 300 MG tablet   . Alogliptin Benzoate 12.5 MG TABS Take by mouth.  Marland Kitchen amLODipine (NORVASC) 5 MG tablet Take 5 mg by mouth daily.  Marland Kitchen aspirin EC 81 MG tablet Take 81 mg by mouth daily.  Marland Kitchen atorvastatin (LIPITOR) 10 MG tablet Take 10 mg by mouth every evening.  . Calcium Carbonate-Vit D-Min 600-400 MG-UNIT TABS Take 1 tablet by mouth 2 (two) times daily.  . carvedilol (COREG) 3.125 MG tablet Take 12.5 mg by mouth 2 (two) times daily with a meal.  . DICLOFENAC PO Apply 1 % topically 3 (three) times daily.  . famotidine (PEPCID) 20 MG tablet Take 20 mg by mouth 2 (two) times  daily.  . finasteride (PROSCAR) 5 MG tablet Take 5 mg by mouth daily.  . furosemide (LASIX) 20 MG tablet Take 20 mg by mouth once as needed. fluid  . gabapentin (NEURONTIN) 100 MG capsule Take 100 mg by mouth 2 (two) times daily.  . Glucosamine HCl 1500 MG TABS Take 1 tablet by mouth every morning.  Marland Kitchen glucose blood (FREESTYLE LITE) test strip 1 each by Other route as needed for other. Use as instructed  . glucose blood (PRECISION XTRA TEST STRIPS) test strip 1 each by Other route as needed for other. Use as instructed  . guaifenesin (HUMIBID E) 400 MG TABS tablet Take 400 mg by mouth every 4 (four) hours.  . hydrochlorothiazide (HYDRODIURIL) 25 MG tablet Take 12.5 mg by mouth daily.  . insulin glargine (LANTUS) 100 UNIT/ML injection Inject 30 Units into the skin at bedtime. Based on blood sugar  . Insulin Pen Needle (PEN NEEDLES) 31G X 8 MM MISC by Does not apply route.  Marland Kitchen losartan (COZAAR) 50 MG tablet Take 25 mg by mouth daily.  . Magnesium Oxide 420 (252 Mg) MG TABS Take 1 tablet by mouth.  . METHYL SALICYLATE-LIDO-MENTHOL EX Apply 10-15 % topically. Apply small amount to affected area three times a day  . Multiple Vitamin (MULTIVITAMIN) capsule Take 1 capsule by mouth daily.  . nitroGLYCERIN (NITROSTAT) 0.4 MG SL tablet Place 0.4 mg under the tongue every 5 (five) minutes as needed. For chest pain  . pantoprazole (PROTONIX) 40 MG tablet Take 40 mg by mouth daily.  . polyethylene glycol (MIRALAX / GLYCOLAX) packet Take 17 g by mouth daily as needed. Constipation  . SYSTANE BALANCE 0.6 % SOLN    No current facility-administered medications on file prior to visit.     Allergies:   Statins and Metoprolol   Social History   Tobacco Use  . Smoking status: Former Smoker    Years: 30.00    Types: Pipe, Cigars    Quit date: 12/01/1992    Years since quitting: 26.7  . Smokeless tobacco: Never Used  Substance Use Topics  . Alcohol use: No  . Drug use: No    Family History: The  patient's family history includes Asthma in his father; COPD in his father; Cancer in his brother and sister; Coronary artery disease in an other family member; Heart disease in his brother and father; High blood pressure in his father; Nephrolithiasis in his sister.  ROS:   Please see the history of present illness.  Additional pertinent ROS otherwise unremarkable except as noted.   EKGs/Labs/Other Studies Reviewed:    The following studies were reviewed today: From Care Everywhere: Cath 2017: Diagnostic Procedure Summary Native LAD,Cx and RCA occluded. SVG to RCA and OM patent. SVG  to D1 occluded. Native D1 was stented, stent is clear. Prev stent in RCA -SVG is patent. LVEDP normal, no LV gram due to elevated Creat. Diagnostic Procedure Recommendations Medical Therapy because no interventional therapy is required.  Echo 2016: Findings Mitral Valve Normal mitral valve structure and mobility. No significant regurgitation. Aortic Valve Trileaflet valve with mild sclerosis, good mobility, no regurgitation and no evidence of stenosis. Tricuspid Valve Tricuspid valve is structurally normal. Mild tricuspid regurgitation by color flow doppler examination. Normal estimated pulmonary artery pressure Pulmonic Valve Pulmonic valve is structurally normal. Mild pulmonic regurgitation present by color flow doppler examination. Left Atrium Normal size left atrium. Left atrial volume index of 14.4 ml per meters squared BSA. Left Ventricle Mild left ventricular hypertrophy Normal left ventricular size and systolic function with no appreciable segmental abnormality. Right Atrium Normal right atrium. Right Ventricle Normal right ventricle structure and function. Pericardial Effusion No evidence of pericardial effusion. Miscellaneous The aorta is within normal limits. The IVC is normal  Cath 2013:  Recent 100% occlusion of the SVG-Diag1 as the likely culprit for recent unstable  angina.   Status/Post Successful PCI of theproximal Native Diag1 lesion with a Promus Element DES 2.5 mm x 12 mm, post-dilated to 2.75 mm.   Widely patent BMS Stent in the SVG-RCA along with there rest of the graft.   Widely patent SVG-OM and LIMA-LAD.   Normal, preserved LVEF with normal LVEDP.  EKG:  EKG is personally reviewed.  The ekg ordered today demonstrates sinus bradycardia at a rate of 53 bpm.  Recent Labs: No results found for requested labs within last 8760 hours.  Recent Lipid Panel    Component Value Date/Time   CHOL  10/31/2008 0350    110        ATP III CLASSIFICATION:  <200     mg/dL   Desirable  200-239  mg/dL   Borderline High  >=240    mg/dL   High          TRIG 43 10/31/2008 0350   HDL 43 10/31/2008 0350   CHOLHDL 2.6 10/31/2008 0350   VLDL 9 10/31/2008 0350   LDLCALC  10/31/2008 0350    58        Total Cholesterol/HDL:CHD Risk Coronary Heart Disease Risk Table                     Men   Women  1/2 Average Risk   3.4   3.3  Average Risk       5.0   4.4  2 X Average Risk   9.6   7.1  3 X Average Risk  23.4   11.0        Use the calculated Patient Ratio above and the CHD Risk Table to determine the patient's CHD Risk.        ATP III CLASSIFICATION (LDL):  <100     mg/dL   Optimal  100-129  mg/dL   Near or Above                    Optimal  130-159  mg/dL   Borderline  160-189  mg/dL   High  >190     mg/dL   Very High    Physical Exam:    VS:  BP 118/74   Pulse (!) 53   Ht 5\' 10"  (1.778 m)   Wt 241 lb 9.6 oz (109.6 kg)   SpO2 99%   BMI 34.67 kg/m  Wt Readings from Last 3 Encounters:  08/29/19 241 lb 9.6 oz (109.6 kg)  03/30/19 242 lb (109.8 kg)  07/02/18 252 lb 9.6 oz (114.6 kg)    GEN: Well nourished, well developed in no acute distress HEENT: Normal, moist mucous membranes NECK: No JVD CARDIAC: regular rhythm, normal S1 and S2, no rubs or gallops. No murmur. VASCULAR: Radial and DP pulses 2+ bilaterally. No carotid  bruits RESPIRATORY:  Clear to auscultation without rales, wheezing or rhonchi  ABDOMEN: Soft, non-tender, non-distended MUSCULOSKELETAL:  Ambulates independently SKIN: Warm and dry, no edema NEUROLOGIC:  Alert and oriented x 3. No focal neuro deficits noted. PSYCHIATRIC:  Normal affect    ASSESSMENT:    1. Chest pain, unspecified type   2. DOE (dyspnea on exertion)   3. Coronary artery disease involving native heart with other form of angina pectoris, unspecified vessel or lesion type ([place])   4. Hyperlipidemia LDL goal <70   5. History of bradycardia   6. Diabetes mellitus type 2 in obese (HCC)   7. Cardiac risk counseling   8. Counseling on health promotion and disease prevention    PLAN:    CAD s/p CABG 1996, PCI 2010, PCI 2013: reports 4-5 episodes in the last month and a half of fleeting, nonexertional lower left chest discomfort that radiates and becomes left arm numbness. While there are atypical components, he reports this is very similar to his symptoms prior to his CABG -we discussed options for evaluation. Discussed medical management, which is very limited given his hypotension and bradycardia. Discussed that cath/contrast not without risk given his chronic kidney disease -as his symptoms are at rest, will start with echocardiogram to evaluate for drop in EF/significant wall motion abnormalities. Prior per Care Everywhere reported as normal EF/no WMA -if moderately abnormal echo, consider nuclear stress test -if severely abnormal echo, will need to discuss pros/cons of cath -will stop cardura given his intermittent hypotension at home. If his BP rises consistently, would then add low dose imdur as his pain is nitro responsive. -continue atorvastatin 10 mg (cannot tolerate higher doses) -continue carvedilol 3.125 mg BID. Cannot tolerate higher doses due to bradycardia -he had chronic dyspnea on exertion, largely unchanged. May be anginal equivalent, workup as  above  Hyperlipidemia: -atorvastatin 10 mg, cannot tolerate higher doses -lipids as noted above  Hypertension: reports no high blood pressures at home, does have occasional lows and lightheadedness, no syncope -continue amlodipine 5 mg daily, HCTZ 12.5 mg daily, carvedilol as above, losartan 25 mg daily -stopping cardura (doxazosin) today as above   History of bradycardia: did not tolerate metoprolol. No room to titrate carvedilol.  Type II diabetes with obesity -on lantus insulin -on alogliptin (DPP4i). Given history of CAD, would recommend SGLT2i or GLP1RA, will defer to endocrinologist  Secondary prevention -recommend heart healthy/Mediterranean diet, with whole grains, fruits, vegetable, fish, lean meats, nuts, and olive oil. Limit salt. -recommend moderate walking, 3-5 times/week for 30-50 minutes each session. Aim for at least 150 minutes.week. Goal should be pace of 3 miles/hours, or walking 1.5 miles in 30 minutes -recommend avoidance of tobacco products. Avoid excess alcohol.  Plan for follow up: 3 mos or sooner based on symptoms and results of testing  Total time of encounter: 61 minutes total time of encounter, including 46 minutes spent in face-to-face patient care. This time includes coordination of care and counseling regarding symptoms, options for management, risks/benefits. Remainder of non-face-to-face time involved reviewing chart documents/testing relevant to the patient encounter and documentation  in the medical record.  Buford Dresser, MD, PhD [place]  [facility]   Medication Adjustments/Labs and Tests Ordered: Current medicines are reviewed at length with the patient today.  Concerns regarding medicines are outlined above.  Orders Placed This Encounter  Procedures  . EKG 12-Lead  . ECHOCARDIOGRAM COMPLETE   No orders of the defined types were placed in this encounter.   Patient Instructions  Medication Instructions:  Stop Cardura 4 mg  daily  *If you need a refill on your cardiac medications before your next appointment, please call your pharmacy*   Lab Work: None  Testing/Procedures: Your physician has requested that you have an echocardiogram. Echocardiography is a painless test that uses sound waves to create images of your heart. It provides your doctor with information about the size and shape of your heart and how well your heart's chambers and valves are working. This procedure takes approximately one hour. There are no restrictions for this procedure. [place] [address]    Follow-Up: At [facility], you and your health needs are our priority.  As part of our continuing mission to provide you with exceptional heart care, we have created designated Provider Care Teams.  These Care Teams include your primary Cardiologist (physician) and Advanced Practice Providers (APPs -  Physician Assistants and Nurse Practitioners) who all work together to provide you with the care you need, when you need it.  We recommend signing up for the patient portal called "MyChart".  Sign up information is provided on this After Visit Summary.  MyChart is used to connect with patients for Virtual Visits (Telemedicine).  Patients are able to view lab/test results, encounter notes, upcoming appointments, etc.  Non-urgent messages can be sent to your provider as well.   To learn more about what you can do with MyChart, go to [email or web address].    Your next appointment:   3 month(s)  The format for your next appointment:   In Person  Provider:   Buford Dresser, MD   Other Instructions When we call with ECHO results, please provide blood pressure readings.      Signed, Buford Dresser, MD PhD 08/29/2019 12:50 PM    [place]

## 2019-08-29 NOTE — Telephone Encounter (Signed)
New message   Patient has a question about his after visit summary for today. Please call to discuss.

## 2019-09-15 ENCOUNTER — Other Ambulatory Visit: Payer: Self-pay

## 2019-09-15 ENCOUNTER — Ambulatory Visit (HOSPITAL_COMMUNITY): Payer: Medicare Other | Attending: Internal Medicine

## 2019-09-15 DIAGNOSIS — R079 Chest pain, unspecified: Secondary | ICD-10-CM | POA: Diagnosis not present

## 2019-09-15 DIAGNOSIS — R0600 Dyspnea, unspecified: Secondary | ICD-10-CM | POA: Insufficient documentation

## 2019-09-15 DIAGNOSIS — R0609 Other forms of dyspnea: Secondary | ICD-10-CM

## 2019-09-15 HISTORY — PX: TRANSTHORACIC ECHOCARDIOGRAM: SHX275

## 2019-09-22 ENCOUNTER — Telehealth: Payer: Self-pay | Admitting: Cardiology

## 2019-09-22 NOTE — Telephone Encounter (Signed)
Buford Dresser, MD  09/22/2019 1:18 PM EDT    Normal echo. Normal squeeze, no evidence of any abnormalities. Given that the echo does not explain the symptoms, I would recommend moving forward with the [place] nuclear stress test given exertional symptoms and known history of disease.  Pt notified he states that he doe not want to do the [place] right now he "is 84 yo" and doesn't know, will  "keep an eye out" and let you know if, or when he will have it done.

## 2019-09-22 NOTE — Telephone Encounter (Signed)
New message   Patient is returning call for echo results. Please call. 

## 2019-11-01 ENCOUNTER — Telehealth: Payer: Self-pay | Admitting: Cardiology

## 2019-11-01 NOTE — Telephone Encounter (Signed)
   Went to chat to check notes. Pt asking about upcoming appt. Advised for his 3 mos f/u. Pt understood

## 2019-11-08 ENCOUNTER — Other Ambulatory Visit: Payer: Self-pay

## 2019-11-08 ENCOUNTER — Encounter: Payer: Self-pay | Admitting: Cardiology

## 2019-11-08 ENCOUNTER — Ambulatory Visit (INDEPENDENT_AMBULATORY_CARE_PROVIDER_SITE_OTHER): Payer: Medicare Other | Admitting: Cardiology

## 2019-11-08 VITALS — BP 144/76 | HR 70 | Ht 70.0 in | Wt 239.8 lb

## 2019-11-08 DIAGNOSIS — E1169 Type 2 diabetes mellitus with other specified complication: Secondary | ICD-10-CM | POA: Diagnosis not present

## 2019-11-08 DIAGNOSIS — I25118 Atherosclerotic heart disease of native coronary artery with other forms of angina pectoris: Secondary | ICD-10-CM

## 2019-11-08 DIAGNOSIS — Z7189 Other specified counseling: Secondary | ICD-10-CM

## 2019-11-08 DIAGNOSIS — E785 Hyperlipidemia, unspecified: Secondary | ICD-10-CM | POA: Diagnosis not present

## 2019-11-08 DIAGNOSIS — I2510 Atherosclerotic heart disease of native coronary artery without angina pectoris: Secondary | ICD-10-CM

## 2019-11-08 DIAGNOSIS — E669 Obesity, unspecified: Secondary | ICD-10-CM

## 2019-11-08 NOTE — Patient Instructions (Signed)

## 2019-11-08 NOTE — Progress Notes (Signed)
Cardiology Office Note:    Date:  11/08/2019   ID:  Dale Haynes, DOB 12/24/1935, MRN [id number]  PCP:  Dr. Girtha Hake  Cardiologist:  Buford Dresser, MD PhD  Referring MD: [facility], Dr. Girtha Hake  CC: follow up  History of Present Illness:    Dale Haynes is a 84 y.o. male with a hx of CAD s/p CABG in 1996 as well as PCI, type II diabetes, hypertension, bradycardia, hyperlipidemia who is seen for follow up today. I initially saw him as a new consult at the request of Dr. Shirleen Schirmer for the evaluation and management of CAD on 07/01/18. He had been followed through community care by [facility] most recently prior to this, remotely by Dr. Ellyn Hack (last 2013). [facility].  Today: Notes from [place] dated 08/26/19 received and reviewed. This note is prior to his visit on 08/29/19 with me. Noted to be using nitroglycerin more frequently due to chest and left arm pain. This was addressed at our prior visit.  We reviewed recent results. Echo from 09/15/19 was normal. I recommended lexiscan given his symptoms, but he declined. Would prefer not to do a stress test unless things become much worse. He believes his pain has improved since changing his sleeping position. No longer with chest or arm pain.  Denies chest pain, shortness of breath at rest or with normal exertion. No PND, orthopnea, change in LE edema or unexpected weight gain. No syncope or palpitations.  Brings log of BP and HR today. Lowest 94/67, highest 121/73. Most 100s-110s/60-70. HR 60s-70s. Able to walk at least 07-2998 steps/day, sometimes more. No limitations for activities he wants to do.   Past Medical History:  Diagnosis Date  . Anginal pain ([place])   . Arthritis    "neck; hands"  . BPH (benign prostatic hyperplasia)   . Bursitis of shoulder, right    "I've taken alot of shots for that"  . CAD S/P percutaneous coronary angioplasty 1996 CABG, 2010 PCII   s/ CABG  & PCI  (SVG-RCA 80% lesion stented  BMS 4.0 mm x 18 mm, post-dilated to ~4.6 mm)  . Exertional dyspnea   . GERD (gastroesophageal reflux disease)   . Gout   . Gout of big toe    "left"  . Headache(784.0)    "not a routine thing"  . Hyperlipidemia LDL goal < 70   . Hypertension associated with diabetes ([place])   . Kidney stones    "lots"  . Paroxysmal atrial fibrillation (HCC)   . Skin cancer    "face, ears, head, arms"    Past Surgical History:  Procedure Laterality Date  . APPENDECTOMY  1959  . CHOLECYSTECTOMY  1980's  . CORONARY ANGIOPLASTY WITH STENT PLACEMENT  2010; 12/02/2011   "1 + 1; 2 total"  . CORONARY ARTERY BYPASS GRAFT  1996   LIMA-LAD, SVG-DIAG, SVG-OM, SVG-RCA  . [place]; [phone]   right; left  . LEFT HEART CATHETERIZATION WITH CORONARY/GRAFT ANGIOGRAM N/A 12/02/2011   Procedure: LEFT HEART CATHETERIZATION WITH Beatrix Fetters;  Surgeon: Leonie Man, MD;  Location: [facility] CATH LAB;  Service: Cardiovascular;  Laterality: N/A;  . SKIN CANCER EXCISION     multiple    Current Medications: Current Outpatient Medications on File Prior to Visit  Medication Sig  . acetaminophen (TYLENOL) 500 MG tablet   . acetaminophen (TYLENOL) 650 MG CR tablet Take 650 mg by mouth every 8 (eight) hours as needed  for pain.  Marland Kitchen allopurinol (ZYLOPRIM) 300 MG tablet   . Alogliptin Benzoate 12.5 MG TABS Take by mouth.  Marland Kitchen amLODipine (NORVASC) 5 MG tablet Take 5 mg by mouth daily.  Marland Kitchen aspirin EC 81 MG tablet Take 81 mg by mouth daily.  Marland Kitchen atorvastatin (LIPITOR) 10 MG tablet Take 10 mg by mouth every evening.  . Calcium Carbonate-Vit D-Min 600-400 MG-UNIT TABS Take 1 tablet by mouth 2 (two) times daily.  . carvedilol (COREG) 3.125 MG tablet Take 12.5 mg by mouth 2 (two) times daily with a meal.  . DICLOFENAC PO Apply 1 % topically 3 (three) times daily.  . famotidine (PEPCID) 20 MG tablet Take 20 mg by mouth 2 (two) times daily.  . finasteride (PROSCAR) 5 MG tablet  Take 5 mg by mouth daily.  . furosemide (LASIX) 20 MG tablet Take 20 mg by mouth once as needed. fluid  . gabapentin (NEURONTIN) 100 MG capsule Take 100 mg by mouth 2 (two) times daily.  . Glucosamine HCl 1500 MG TABS Take 1 tablet by mouth every morning.  Marland Kitchen glucose blood (FREESTYLE LITE) test strip 1 each by Other route as needed for other. Use as instructed  . glucose blood (PRECISION XTRA TEST STRIPS) test strip 1 each by Other route as needed for other. Use as instructed  . guaifenesin (HUMIBID E) 400 MG TABS tablet Take 400 mg by mouth every 4 (four) hours.  . hydrochlorothiazide (HYDRODIURIL) 25 MG tablet Take 12.5 mg by mouth daily.  . insulin glargine (LANTUS) 100 UNIT/ML injection Inject 30 Units into the skin at bedtime. Based on blood sugar  . Insulin Pen Needle (PEN NEEDLES) 31G X 8 MM MISC by Does not apply route.  Marland Kitchen losartan (COZAAR) 50 MG tablet Take 25 mg by mouth daily.  . Magnesium Oxide 420 (252 Mg) MG TABS Take 1 tablet by mouth.  . METHYL SALICYLATE-LIDO-MENTHOL EX Apply 10-15 % topically. Apply small amount to affected area three times a day  . Multiple Vitamin (MULTIVITAMIN) capsule Take 1 capsule by mouth daily.  . nitroGLYCERIN (NITROSTAT) 0.4 MG SL tablet Place 0.4 mg under the tongue every 5 (five) minutes as needed. For chest pain  . pantoprazole (PROTONIX) 40 MG tablet Take 40 mg by mouth daily.  . polyethylene glycol (MIRALAX / GLYCOLAX) packet Take 17 g by mouth daily as needed. Constipation  . SYSTANE BALANCE 0.6 % SOLN    No current facility-administered medications on file prior to visit.     Allergies:   Statins and Metoprolol   Social History   Tobacco Use  . Smoking status: Former Smoker    Years: 30.00    Types: Pipe, Cigars    Quit date: 12/01/1992    Years since quitting: 26.9  . Smokeless tobacco: Never Used  Substance Use Topics  . Alcohol use: No  . Drug use: No    Family History: The patient's family history includes Asthma in his  father; COPD in his father; Cancer in his brother and sister; Coronary artery disease in an other family member; Heart disease in his brother and father; High blood pressure in his father; Nephrolithiasis in his sister.  ROS:   Please see the history of present illness.  Additional pertinent ROS otherwise unremarkable except as noted.   EKGs/Labs/Other Studies Reviewed:    The following studies were reviewed today: From Care Everywhere: Cath 2017: Diagnostic Procedure Summary Native LAD,Cx and RCA occluded. SVG to RCA and OM patent. SVG to D1 occluded. Native  D1 was stented, stent is clear. Prev stent in RCA -SVG is patent. LVEDP normal, no LV gram due to elevated Creat. Diagnostic Procedure Recommendations Medical Therapy because no interventional therapy is required.  Echo 2016: Findings Mitral Valve Normal mitral valve structure and mobility. No significant regurgitation. Aortic Valve Trileaflet valve with mild sclerosis, good mobility, no regurgitation and no evidence of stenosis. Tricuspid Valve Tricuspid valve is structurally normal. Mild tricuspid regurgitation by color flow doppler examination. Normal estimated pulmonary artery pressure Pulmonic Valve Pulmonic valve is structurally normal. Mild pulmonic regurgitation present by color flow doppler examination. Left Atrium Normal size left atrium. Left atrial volume index of 14.4 ml per meters squared BSA. Left Ventricle Mild left ventricular hypertrophy Normal left ventricular size and systolic function with no appreciable segmental abnormality. Right Atrium Normal right atrium. Right Ventricle Normal right ventricle structure and function. Pericardial Effusion No evidence of pericardial effusion. Miscellaneous The aorta is within normal limits. The IVC is normal  Cath 2013:  Recent 100% occlusion of the SVG-Diag1 as the likely culprit for recent unstable angina.   Status/Post Successful PCI of  theproximal Native Diag1 lesion with a Promus Element DES 2.5 mm x 12 mm, post-dilated to 2.75 mm.   Widely patent BMS Stent in the SVG-RCA along with there rest of the graft.   Widely patent SVG-OM and LIMA-LAD.   Normal, preserved LVEF with normal LVEDP.  EKG:  EKG is personally reviewed.  The ekg ordered 08/29/19 demonstrates sinus bradycardia at a rate of 53 bpm.  Recent Labs: No results found for requested labs within last 8760 hours.  Recent Lipid Panel    Component Value Date/Time   CHOL  10/31/2008 0350    110        ATP III CLASSIFICATION:  <200     mg/dL   Desirable  200-239  mg/dL   Borderline High  >=240    mg/dL   High          TRIG 43 10/31/2008 0350   HDL 43 10/31/2008 0350   CHOLHDL 2.6 10/31/2008 0350   VLDL 9 10/31/2008 0350   LDLCALC  10/31/2008 0350    58        Total Cholesterol/HDL:CHD Risk Coronary Heart Disease Risk Table                     Men   Women  1/2 Average Risk   3.4   3.3  Average Risk       5.0   4.4  2 X Average Risk   9.6   7.1  3 X Average Risk  23.4   11.0        Use the calculated Patient Ratio above and the CHD Risk Table to determine the patient's CHD Risk.        ATP III CLASSIFICATION (LDL):  <100     mg/dL   Optimal  100-129  mg/dL   Near or Above                    Optimal  130-159  mg/dL   Borderline  160-189  mg/dL   High  >190     mg/dL   Very High    Physical Exam:    VS:  BP (!) 144/76   Pulse 70   Ht 5\' 10"  (1.778 m)   Wt 239 lb 12.8 oz (108.8 kg)   SpO2 97%   BMI 34.41 kg/m  Wt Readings from Last 3 Encounters:  11/08/19 239 lb 12.8 oz (108.8 kg)  08/29/19 241 lb 9.6 oz (109.6 kg)  03/30/19 242 lb (109.8 kg)    GEN: Well nourished, well developed in no acute distress HEENT: Normal, moist mucous membranes NECK: No JVD CARDIAC: regular rhythm, normal S1 and S2, no rubs or gallops. No murmur. VASCULAR: Radial and DP pulses 2+ bilaterally. No carotid bruits RESPIRATORY:  Clear to auscultation  without rales, wheezing or rhonchi  ABDOMEN: Soft, non-tender, non-distended MUSCULOSKELETAL:  Ambulates independently SKIN: Warm and dry, no edema NEUROLOGIC:  Alert and oriented x 3. No focal neuro deficits noted. PSYCHIATRIC:  Normal affect   ASSESSMENT:    1. Coronary artery disease involving native heart without angina pectoris, unspecified vessel or lesion type   2. Hyperlipidemia LDL goal <70   3. Diabetes mellitus type 2 in obese (HCC)   4. Cardiac risk counseling   5. Counseling on health promotion and disease prevention    PLAN:    CAD s/p CABG 1996, PCI 2010, PCI 2013: previously with chest and arm pain, now resolved -echo without wall motion abnormalities -declined lexiscan stress test, would like to pursue only if symptoms become severe -continue atorvastatin 10 mg (cannot tolerate higher doses) -continue carvedilol 3.125 mg BID. Cannot tolerate higher doses due to bradycardia -counseled on red flag warning signs that need immediate medical attention  Hyperlipidemia: -atorvastatin 10 mg, cannot tolerate higher doses  Hypertension: home blood pressures much better and occasionally borderline hypotensive -will not intensify treatment based on home numbers. Suspect component of white coat hypertension -continue amlodipine 5 mg daily, HCTZ 12.5 mg daily, carvedilol as above, losartan 25 mg daily   History of bradycardia: did not tolerate metoprolol. No room to titrate carvedilol.  Type II diabetes with obesity -on lantus insulin -on alogliptin (DPP4i). Given history of CAD, would recommend SGLT2i or GLP1RA, will defer to endocrinologist  Secondary prevention -recommend heart healthy/Mediterranean diet, with whole grains, fruits, vegetable, fish, lean meats, nuts, and olive oil. Limit salt. -recommend moderate walking, 3-5 times/week for 30-50 minutes each session. Aim for at least 150 minutes.week. Goal should be pace of 3 miles/hours, or walking 1.5 miles in 30  minutes -recommend avoidance of tobacco products. Avoid excess alcohol.  Plan for follow up: 6 mos or sooner as needed  Buford Dresser, MD, PhD [place]  [facility]   Medication Adjustments/Labs and Tests Ordered: Current medicines are reviewed at length with the patient today.  Concerns regarding medicines are outlined above.  No orders of the defined types were placed in this encounter.  No orders of the defined types were placed in this encounter.   Patient Instructions  Medication Instructions:  Your Physician recommend you continue on your current medication as directed.   *If you need a refill on your cardiac medications before your next appointment, please call your pharmacy*   Lab Work: None   Testing/Procedures: None   Follow-Up: At [facility], you and your health needs are our priority.  As part of our continuing mission to provide you with exceptional heart care, we have created designated Provider Care Teams.  These Care Teams include your primary Cardiologist (physician) and Advanced Practice Providers (APPs -  Physician Assistants and Nurse Practitioners) who all work together to provide you with the care you need, when you need it.  We recommend signing up for the patient portal called "MyChart".  Sign up information is provided on this After Visit Summary.  MyChart  is used to connect with patients for Virtual Visits (Telemedicine).  Patients are able to view lab/test results, encounter notes, upcoming appointments, etc.  Non-urgent messages can be sent to your provider as well.   To learn more about what you can do with MyChart, go to [email or web address].    Your next appointment:   6 month(s)  The format for your next appointment:   In Person  Provider:   Buford Dresser, MD      Signed, Buford Dresser, MD PhD 11/08/2019 1:39 PM    [address]

## 2020-03-29 ENCOUNTER — Encounter: Payer: Self-pay | Admitting: Cardiology

## 2020-03-29 ENCOUNTER — Ambulatory Visit (INDEPENDENT_AMBULATORY_CARE_PROVIDER_SITE_OTHER): Payer: Medicare Other | Admitting: Cardiology

## 2020-03-29 ENCOUNTER — Other Ambulatory Visit: Payer: Self-pay

## 2020-03-29 VITALS — BP 146/80 | HR 59 | Ht 70.0 in | Wt 245.0 lb

## 2020-03-29 DIAGNOSIS — E785 Hyperlipidemia, unspecified: Secondary | ICD-10-CM

## 2020-03-29 DIAGNOSIS — I25118 Atherosclerotic heart disease of native coronary artery with other forms of angina pectoris: Secondary | ICD-10-CM

## 2020-03-29 DIAGNOSIS — E1169 Type 2 diabetes mellitus with other specified complication: Secondary | ICD-10-CM | POA: Diagnosis not present

## 2020-03-29 DIAGNOSIS — Z951 Presence of aortocoronary bypass graft: Secondary | ICD-10-CM | POA: Diagnosis not present

## 2020-03-29 DIAGNOSIS — Z87898 Personal history of other specified conditions: Secondary | ICD-10-CM | POA: Diagnosis not present

## 2020-03-29 DIAGNOSIS — E669 Obesity, unspecified: Secondary | ICD-10-CM

## 2020-03-29 NOTE — Progress Notes (Signed)
Cardiology Office Note:    Date:  03/29/2020   ID:  Dale Haynes, DOB Nov 30, 1935, MRN [id number]  PCP:  Dr. Girtha Hake  Cardiologist:  Buford Dresser, MD PhD  Referring MD: [facility], Dr. Girtha Hake  CC: follow up  History of Present Illness:    Dale Haynes is a 84 y.o. male with a hx of CAD s/p CABG in 1996 as well as PCI, type II diabetes, hypertension, bradycardia, hyperlipidemia who is seen for follow up today. I initially saw him as a new consult at the request of Dr. Shirleen Schirmer for the evaluation and management of CAD on 07/01/18. He had been followed through community care by [facility] most recently prior to this, remotely by Dr. Ellyn Hack (last 2013). [facility].  Today: Brings labs with him today from the [place], dated 02/20/20: Tchol 171, TG 157, HDL 47, LDL 93  Brings logs of BP and HR. Range BP 102/70, HR 54 to 152/88, HR 83  Had one episode when HR was 44. Was resting, no symptoms. Happen to notice incidentally. Has not noted other heart rates less than 50 bpm.  Has rare chest tightness, brief, non exertional. Most recent episode was yesterday, shortly after lunch, was very mild and self limited. Feels like a mild tightness in his chest. Thinks it was indigestion, has an appt next week with GI for reflux. Has tried nitroglycerin once since our last visit.   Denies shortness of breath at rest or with normal exertion. No PND, orthopnea, LE edema or unexpected weight gain. No syncope or palpitations.  Past Medical History:  Diagnosis Date  . Anginal pain ([place])   . Arthritis    "neck; hands"  . BPH (benign prostatic hyperplasia)   . Bursitis of shoulder, right    "I've taken alot of shots for that"  . CAD S/P percutaneous coronary angioplasty 1996 CABG, 2010 PCII   s/ CABG & PCI  (SVG-RCA 80% lesion stented  BMS 4.0 mm x 18 mm, post-dilated to ~4.6 mm)  . Exertional dyspnea   . GERD (gastroesophageal  reflux disease)   . Gout   . Gout of big toe    "left"  . Headache(784.0)    "not a routine thing"  . Hyperlipidemia LDL goal < 70   . Hypertension associated with diabetes ([place])   . Kidney stones    "lots"  . Paroxysmal atrial fibrillation (H[facility])   . Skin cancer    "face, ears, head, arms"    Past Surgical History:  Procedure Laterality Date  . APPENDECTOMY  1959  . CHOLECYSTECTOMY  1980's  . CORONARY ANGIOPLASTY WITH STENT PLACEMENT  2010; 12/02/2011   "1 + 1; 2 total"  . CORONARY ARTERY BYPASS GRAFT  1996   LIMA-LAD, SVG-DIAG, SVG-OM, SVG-RCA  . [place]; [phone]   right; left  . LEFT HEART CATHETERIZATION WITH CORONARY/GRAFT ANGIOGRAM N/A 12/02/2011   Procedure: LEFT HEART CATHETERIZATION WITH Beatrix Fetters;  Surgeon: Leonie Man, MD;  Location: [facility] CATH LAB;  Service: Cardiovascular;  Laterality: N/A;  . SKIN CANCER EXCISION     multiple    Current Medications: Current Outpatient Medications on File Prior to Visit  Medication Sig  . acetaminophen (TYLENOL) 500 MG tablet   . acetaminophen (TYLENOL) 650 MG CR tablet Take 650 mg by mouth every 8 (eight) hours as needed for pain.  Marland Kitchen allopurinol (ZYLOPRIM) 300 MG tablet   . Alogliptin Benzoate 12.5 MG  TABS Take by mouth.  Marland Kitchen amLODipine (NORVASC) 5 MG tablet Take 5 mg by mouth daily.  Marland Kitchen aspirin EC 81 MG tablet Take 81 mg by mouth daily.  Marland Kitchen atorvastatin (LIPITOR) 10 MG tablet Take 10 mg by mouth every evening.  . Calcium Carbonate-Vit D-Min 600-400 MG-UNIT TABS Take 1 tablet by mouth 2 (two) times daily.  . carvedilol (COREG) 3.125 MG tablet Take 12.5 mg by mouth 2 (two) times daily with a meal.  . DICLOFENAC PO Apply 1 % topically 3 (three) times daily.  . famotidine (PEPCID) 20 MG tablet Take 20 mg by mouth 2 (two) times daily.  . finasteride (PROSCAR) 5 MG tablet Take 5 mg by mouth daily.  . furosemide (LASIX) 20 MG tablet Take 20 mg by mouth once as needed. fluid  . gabapentin (NEURONTIN) 100  MG capsule Take 100 mg by mouth 2 (two) times daily.  . Glucosamine HCl 1500 MG TABS Take 1 tablet by mouth every morning.  Marland Kitchen glucose blood (FREESTYLE LITE) test strip 1 each by Other route as needed for other. Use as instructed  . glucose blood (PRECISION XTRA TEST STRIPS) test strip 1 each by Other route as needed for other. Use as instructed  . guaifenesin (HUMIBID E) 400 MG TABS tablet Take 400 mg by mouth every 4 (four) hours.  . hydrochlorothiazide (HYDRODIURIL) 25 MG tablet Take 12.5 mg by mouth daily.  . insulin glargine (LANTUS) 100 UNIT/ML injection Inject 30 Units into the skin at bedtime. Based on blood sugar  . Insulin Pen Needle (PEN NEEDLES) 31G X 8 MM MISC by Does not apply route.  Marland Kitchen losartan (COZAAR) 50 MG tablet Take 25 mg by mouth daily.  . Magnesium Oxide 420 (252 Mg) MG TABS Take 1 tablet by mouth.  . METHYL SALICYLATE-LIDO-MENTHOL EX Apply 10-15 % topically. Apply small amount to affected area three times a day  . Multiple Vitamin (MULTIVITAMIN) capsule Take 1 capsule by mouth daily.  . nitroGLYCERIN (NITROSTAT) 0.4 MG SL tablet Place 0.4 mg under the tongue every 5 (five) minutes as needed. For chest pain  . pantoprazole (PROTONIX) 40 MG tablet Take 40 mg by mouth daily.  . polyethylene glycol (MIRALAX / GLYCOLAX) packet Take 17 g by mouth daily as needed. Constipation  . SYSTANE BALANCE 0.6 % SOLN    No current facility-administered medications on file prior to visit.     Allergies:   Statins and Metoprolol   Social History   Tobacco Use  . Smoking status: Former Smoker    Years: 30.00    Types: Pipe, Cigars    Quit date: 12/01/1992    Years since quitting: 27.3  . Smokeless tobacco: Never Used  Substance Use Topics  . Alcohol use: No  . Drug use: No    Family History: The patient's family history includes Asthma in his father; COPD in his father; Cancer in his brother and sister; Coronary artery disease in an other family member; Heart disease in his  brother and father; High blood pressure in his father; Nephrolithiasis in his sister.  ROS:   Please see the history of present illness.  Additional pertinent ROS otherwise unremarkable except as noted.   EKGs/Labs/Other Studies Reviewed:    The following studies were reviewed today: From Care Everywhere: Cath 2017: Diagnostic Procedure Summary Native LAD,Cx and RCA occluded. SVG to RCA and OM patent. SVG to D1 occluded. Native D1 was stented, stent is clear. Prev stent in RCA -SVG is patent. LVEDP normal, no  LV gram due to elevated Creat. Diagnostic Procedure Recommendations Medical Therapy because no interventional therapy is required.  Echo 2016: Findings Mitral Valve Normal mitral valve structure and mobility. No significant regurgitation. Aortic Valve Trileaflet valve with mild sclerosis, good mobility, no regurgitation and no evidence of stenosis. Tricuspid Valve Tricuspid valve is structurally normal. Mild tricuspid regurgitation by color flow doppler examination. Normal estimated pulmonary artery pressure Pulmonic Valve Pulmonic valve is structurally normal. Mild pulmonic regurgitation present by color flow doppler examination. Left Atrium Normal size left atrium. Left atrial volume index of 14.4 ml per meters squared BSA. Left Ventricle Mild left ventricular hypertrophy Normal left ventricular size and systolic function with no appreciable segmental abnormality. Right Atrium Normal right atrium. Right Ventricle Normal right ventricle structure and function. Pericardial Effusion No evidence of pericardial effusion. Miscellaneous The aorta is within normal limits. The IVC is normal  Cath 2013:  Recent 100% occlusion of the SVG-Diag1 as the likely culprit for recent unstable angina.   Status/Post Successful PCI of theproximal Native Diag1 lesion with a Promus Element DES 2.5 mm x 12 mm, post-dilated to 2.75 mm.   Widely patent BMS Stent in the SVG-RCA  along with there rest of the graft.   Widely patent SVG-OM and LIMA-LAD.   Normal, preserved LVEF with normal LVEDP.  EKG:  EKG is personally reviewed.  The ekg ordered today demonstrates sinus bradycardia at a rate of 59 bpm.  Recent Labs: No results found for requested labs within last 8760 hours.  Recent Lipid Panel    Component Value Date/Time   CHOL  10/31/2008 0350    110        ATP III CLASSIFICATION:  <200     mg/dL   Desirable  200-239  mg/dL   Borderline High  >=240    mg/dL   High          TRIG 43 10/31/2008 0350   HDL 43 10/31/2008 0350   CHOLHDL 2.6 10/31/2008 0350   VLDL 9 10/31/2008 0350   LDLCALC  10/31/2008 0350    58        Total Cholesterol/HDL:CHD Risk Coronary Heart Disease Risk Table                     Men   Women  1/2 Average Risk   3.4   3.3  Average Risk       5.0   4.4  2 X Average Risk   9.6   7.1  3 X Average Risk  23.4   11.0        Use the calculated Patient Ratio above and the CHD Risk Table to determine the patient's CHD Risk.        ATP III CLASSIFICATION (LDL):  <100     mg/dL   Optimal  100-129  mg/dL   Near or Above                    Optimal  130-159  mg/dL   Borderline  160-189  mg/dL   High  >190     mg/dL   Very High    Physical Exam:    VS:  BP (!) 146/80 (BP Location: Left Arm, Patient Position: Sitting, Cuff Size: Large)   Pulse (!) 59   Ht 5\' 10"  (1.778 m)   Wt 245 lb (111.1 kg)   BMI 35.15 kg/m     Wt Readings from Last 3 Encounters:  03/29/20 245 lb (111.1  kg)  11/08/19 239 lb 12.8 oz (108.8 kg)  08/29/19 241 lb 9.6 oz (109.6 kg)    GEN: Well nourished, well developed in no acute distress HEENT: Normal, moist mucous membranes NECK: No JVD CARDIAC: regular rhythm, normal S1 and S2, no rubs or gallops. No murmur. VASCULAR: Radial and DP pulses 2+ bilaterally. No carotid bruits RESPIRATORY:  Clear to auscultation without rales, wheezing or rhonchi  ABDOMEN: Soft, non-tender, non-distended MUSCULOSKELETAL:   Ambulates independently SKIN: Warm and dry, no edema. Compression stockings in place. NEUROLOGIC:  Alert and oriented x 3. No focal neuro deficits noted. PSYCHIATRIC:  Normal affect   ASSESSMENT:    1. Coronary artery disease involving native heart with other form of angina pectoris, unspecified vessel or lesion type ([place])   2. Hyperlipidemia LDL goal <70   3. Diabetes mellitus type 2 in obese ([place])   4. History of bradycardia   5. History of coronary artery bypass graft    PLAN:    CAD s/p CABG 1996, PCI 2010, PCI 2013: previously with chest and arm pain as anginal symtoms -echo without wall motion abnormalities -previously declined lexiscan stress test, would like to pursue only if symptoms become severe -continue atorvastatin 10 mg (cannot tolerate higher doses) -continue carvedilol 3.125 mg BID. Cannot tolerate higher doses due to bradycardia -counseled on red flag warning signs that need immediate medical attention  Hyperlipidemia: -atorvastatin 10 mg, cannot tolerate higher doses -LDL not at goal. We discussed alternatives and options, he would like to stay on current regimen.  Hypertension: has typically well controlled and occasionally borderline low blood pressures on home numbers -Has a component of white coat hypertension, would not overtreat given his home readings -continue amlodipine 5 mg daily, HCTZ 12.5 mg daily, carvedilol as above, losartan 25 mg daily   History of bradycardia: did not tolerate metoprolol. No room to titrate carvedilol given his typical heart rate ranges. No symptoms with single incidental HR less than 50 -he will contact me if he has significantly slower heart rates  Type II diabetes with obesity -on lantus insulin -on alogliptin (DPP4i). Given history of CAD, would recommend SGLT2i or GLP1RA. I gave him recommendations on his AVS to discuss this with the [place] at his upcoming appt next week.  Secondary prevention -recommend heart  healthy/Mediterranean diet, with whole grains, fruits, vegetable, fish, lean meats, nuts, and olive oil. Limit salt. -recommend moderate walking, 3-5 times/week for 30-50 minutes each session. Aim for at least 150 minutes.week. Goal should be pace of 3 miles/hours, or walking 1.5 miles in 30 minutes -recommend avoidance of tobacco products. Avoid excess alcohol.  Plan for follow up: 6 mos or sooner as needed  Buford Dresser, MD, PhD [place]  [facility]   Medication Adjustments/Labs and Tests Ordered: Current medicines are reviewed at length with the patient today.  Concerns regarding medicines are outlined above.  Orders Placed This Encounter  Procedures  . EKG 12-Lead   No orders of the defined types were placed in this encounter.   Patient Instructions  Medication Instructions:  Your Physician recommend you continue on your current medication as directed.    Would consider either SGLT2i or GLP1RA given history of CAD and diabetes, but as you are on insulin, will defer to the team that is managing your sugars. As we discussed, your cholesterol is not at goal, but I understand your preference to not change medications at this time. No change to blood pressure or heart medications today.   *If  you need a refill on your cardiac medications before your next appointment, please call your pharmacy*   Lab Work: None ordered  Testing/Procedures: None ordered   Follow-Up: At [facility], you and your health needs are our priority.  As part of our continuing mission to provide you with exceptional heart care, we have created designated Provider Care Teams.  These Care Teams include your primary Cardiologist (physician) and Advanced Practice Providers (APPs -  Physician Assistants and Nurse Practitioners) who all work together to provide you with the care you need, when you need it.  We recommend signing up for the patient portal called "MyChart".  Sign up information is  provided on this After Visit Summary.  MyChart is used to connect with patients for Virtual Visits (Telemedicine).  Patients are able to view lab/test results, encounter notes, upcoming appointments, etc.  Non-urgent messages can be sent to your provider as well.   To learn more about what you can do with MyChart, go to [email or web address].    Your next appointment:   6 month(s)  The format for your next appointment:   In Person  Provider:   Buford Dresser, MD     Would consider either SGLT2i or GLP1RA given history of CAD and diabetes, but as you are on insulin, will defer to the team that is managing your sugars. As we discussed, your cholesterol is not at goal, but I understand your preference to not change medications at this time. No change to blood pressure or heart medications today.     Signed, Buford Dresser, MD PhD 03/29/2020 9:31 AM    [facility]

## 2020-03-29 NOTE — Patient Instructions (Addendum)
Medication Instructions:  Your Physician recommend you continue on your current medication as directed.    Would consider either SGLT2i or GLP1RA given history of CAD and diabetes, but as you are on insulin, will defer to the team that is managing your sugars. As we discussed, your cholesterol is not at goal, but I understand your preference to not change medications at this time. No change to blood pressure or heart medications today.   *If you need a refill on your cardiac medications before your next appointment, please call your pharmacy*   Lab Work: None ordered  Testing/Procedures: None ordered   Follow-Up: At [facility], you and your health needs are our priority.  As part of our continuing mission to provide you with exceptional heart care, we have created designated Provider Care Teams.  These Care Teams include your primary Cardiologist (physician) and Advanced Practice Providers (APPs -  Physician Assistants and Nurse Practitioners) who all work together to provide you with the care you need, when you need it.  We recommend signing up for the patient portal called "MyChart".  Sign up information is provided on this After Visit Summary.  MyChart is used to connect with patients for Virtual Visits (Telemedicine).  Patients are able to view lab/test results, encounter notes, upcoming appointments, etc.  Non-urgent messages can be sent to your provider as well.   To learn more about what you can do with MyChart, go to [email or web address].    Your next appointment:   6 month(s)  The format for your next appointment:   In Person  Provider:   Buford Dresser, MD     Would consider either SGLT2i or GLP1RA given history of CAD and diabetes, but as you are on insulin, will defer to the team that is managing your sugars. As we discussed, your cholesterol is not at goal, but I understand your preference to not change medications at this time. No change to blood pressure  or heart medications today.

## 2020-09-26 ENCOUNTER — Ambulatory Visit: Payer: Medicare Other | Admitting: Cardiology

## 2020-10-04 ENCOUNTER — Ambulatory Visit: Payer: Medicare Other | Admitting: Cardiology

## 2020-10-07 NOTE — Progress Notes (Signed)
Cardiology Office Note:    Date:  10/11/2020   ID:  Dale Haynes, DOB 1935/06/29, MRN [id number]  PCP:  [facility]  Cardiologist:  Buford Dresser, MD  Electrophysiologist:  None   Referring MD: [facility]   Chief Complaint: follow-up of CAD  History of Present Illness:    Dale Haynes is a 85 y.o. male with a history of CAD s/p CABG in 1996 with subsequent PCI most recently DES to SVG-Diag in 2016, bradycardia, hypertension, hyperlipidemia, type 2 diabetes mellitus, CKD stage III, GERD who is followed by Dr. Harrell Gave and presents today for routine follow-up of CAD.  Patient has a long history of CAD s/p remote CABG in 1996 with subsequent stenting. He was admitted in 2016 with unstable angina and was found to have occluded SVG to Diag which was treated with DES.  Most recent Echo in 09/2019 showed LVEF of 60-65% with normal wall motion and grade 1 diastolic dysfunction. No significant valvular disease. He has had some bradycardia in the past with heart rates as low as the 40's but has tolerated low dose Coreg recently. Patient was most recently seen by Dr. Harrell Gave in 03/2020 at which time he reported rare episodes of brief non-exertional chest tightness that resolved on its own. Patient had previously declined stress test and only wanted to pursue this if symptoms became severe. He was advised to follow-up in 6 months.  Patient presents today for follow-up. Patient doing well since last visit. He does report over the last couple of weeks he has had 2 episodes of mild pain/discomfort down his lower left side and a little down the medial aspect left arm as well. This pain occurred in the morning both times, lasted for about 1 hour, and then resolved. He has difficult describing the pain but thinks it had something to do with the way he was sleeping. No exertional chest pain. He reports this was not similar to his prior symptoms before CABG and PCI and  states he had numbness/tingling down his left arm during those times. He has chronic dyspnea on exertion which is stable. No shortness of breath at rest. No orthopnea or PND. He has chronic lower extremity which is table. No palpitations, lightheadedness, dizziness, or near syncope/syncope.   Reviewed Recent Labs from 08/09/2020 at PCP's Office: - Lipid panel: Total Cholesterol 172, Triglycerides 216, HDL 48, LDL 81.  - Hemoglobin A1c 7.2%.  - CBC: WBC 7.8, Hgb 14.7, Plts 258. - CMET: Na 136, K 4.0, Glucose 196, Cr 1.96, BUN 25, AST 20, ALT 13, Alk Phos 65, Total Bili 0.9, Direct Bili 0.2.  - TSH 1.47.  - Iron panel: Iron 124, TIBC 318, Ferritin 121.4, Iron Sat 39, Transferrin 227.  Past Medical History:  Diagnosis Date  . Anginal pain ([place])   . Arthritis    "neck; hands"  . BPH (benign prostatic hyperplasia)   . Bursitis of shoulder, right    "I've taken alot of shots for that"  . CAD S/P percutaneous coronary angioplasty 1996 CABG, 2010 PCII   s/ CABG & PCI  (SVG-RCA 80% lesion stented  BMS 4.0 mm x 18 mm, post-dilated to ~4.6 mm)  . Exertional dyspnea   . GERD (gastroesophageal reflux disease)   . Gout   . Gout of big toe    "left"  . Headache(784.0)    "not a routine thing"  . Hyperlipidemia LDL goal < 70   . Hypertension associated with diabetes ([place])   .  Kidney stones    "lots"  . Paroxysmal atrial fibrillation (HCC)   . Skin cancer    "face, ears, head, arms"    Past Surgical History:  Procedure Laterality Date  . APPENDECTOMY  1959  . CHOLECYSTECTOMY  1980's  . CORONARY ANGIOPLASTY WITH STENT PLACEMENT  2010; 12/02/2011   "1 + 1; 2 total"  . CORONARY ARTERY BYPASS GRAFT  1996   LIMA-LAD, SVG-DIAG, SVG-OM, SVG-RCA  . [place]; [phone]   right; left  . LEFT HEART CATHETERIZATION WITH CORONARY/GRAFT ANGIOGRAM N/A 12/02/2011   Procedure: LEFT HEART CATHETERIZATION WITH Beatrix Fetters;  Surgeon: Leonie Man, MD;  Location: [facility] CATH LAB;   Service: Cardiovascular;  Laterality: N/A;  . SKIN CANCER EXCISION     multiple    Current Medications: Current Meds  Medication Sig  . acetaminophen (TYLENOL) 500 MG tablet   . acetaminophen (TYLENOL) 650 MG CR tablet Take 650 mg by mouth every 8 (eight) hours as needed for pain.  Marland Kitchen allopurinol (ZYLOPRIM) 300 MG tablet   . Alogliptin Benzoate 12.5 MG TABS Take by mouth.  Marland Kitchen aspirin EC 81 MG tablet Take 81 mg by mouth daily.  Marland Kitchen atorvastatin (LIPITOR) 20 MG tablet Take 1 tablet (20 mg total) by mouth daily.  . Calcium Carbonate-Vit D-Min 600-400 MG-UNIT TABS Take 1 tablet by mouth 2 (two) times daily.  . carvedilol (COREG) 25 MG tablet Take 12.5 mg by mouth 2 (two) times daily with a meal.  . DICLOFENAC PO Apply 1 % topically 3 (three) times daily.  . famotidine (PEPCID) 20 MG tablet Take 20 mg by mouth 2 (two) times daily.  . finasteride (PROSCAR) 5 MG tablet Take 5 mg by mouth daily.  . furosemide (LASIX) 20 MG tablet Take 20 mg by mouth once as needed. fluid  . gabapentin (NEURONTIN) 100 MG capsule Take 100 mg by mouth 2 (two) times daily.  . Glucosamine HCl 1500 MG TABS Take 1 tablet by mouth every morning.  Marland Kitchen glucose blood (FREESTYLE LITE) test strip 1 each by Other route as needed for other. Use as instructed  . glucose blood (PRECISION XTRA TEST STRIPS) test strip 1 each by Other route as needed for other. Use as instructed  . guaifenesin (HUMIBID E) 400 MG TABS tablet Take 400 mg by mouth every 4 (four) hours.  . hydrochlorothiazide (HYDRODIURIL) 25 MG tablet Take 12.5 mg by mouth daily.  . insulin glargine (LANTUS) 100 UNIT/ML injection Inject 30 Units into the skin at bedtime. Based on blood sugar  . Insulin Pen Needle (PEN NEEDLES) 31G X 8 MM MISC by Does not apply route.  Marland Kitchen losartan (COZAAR) 50 MG tablet Take 25 mg by mouth daily.  . Magnesium Oxide 420 (252 Mg) MG TABS Take 1 tablet by mouth.  . METHYL SALICYLATE-LIDO-MENTHOL EX Apply 10-15 % topically. Apply small amount to  affected area three times a day  . Multiple Vitamin (MULTIVITAMIN) capsule Take 1 capsule by mouth daily.  . nitroGLYCERIN (NITROSTAT) 0.4 MG SL tablet Place 0.4 mg under the tongue every 5 (five) minutes as needed. For chest pain  . pantoprazole (PROTONIX) 40 MG tablet Take 40 mg by mouth daily.  . polyethylene glycol (MIRALAX / GLYCOLAX) packet Take 17 g by mouth daily as needed. Constipation  . SYSTANE BALANCE 0.6 % SOLN   . [DISCONTINUED] amLODipine (NORVASC) 5 MG tablet Take 5 mg by mouth daily.  . [DISCONTINUED] atorvastatin (LIPITOR) 10 MG tablet Take 10 mg by mouth  every evening.     Allergies:   Statins and Metoprolol   Social History   Socioeconomic History  . Marital status: Married    Spouse name: Not on file  . Number of children: Not on file  . Years of education: Not on file  . Highest education level: Not on file  Occupational History  . Not on file  Tobacco Use  . Smoking status: Former Smoker    Years: 30.00    Types: Pipe, Cigars    Quit date: 12/01/1992    Years since quitting: 27.8  . Smokeless tobacco: Never Used  Substance and Sexual Activity  . Alcohol use: No  . Drug use: No  . Sexual activity: Not Currently  Other Topics Concern  . Not on file  Social History Narrative   He is retired from [employer], having served 26 years.     Married.   Social Determinants of Health   Financial Resource Strain: Not on file  Food Insecurity: Not on file  Transportation Needs: Not on file  Physical Activity: Not on file  Stress: Not on file  Social Connections: Not on file     Family History: The patient's family history includes Asthma in his father; COPD in his father; Cancer in his brother and sister; Coronary artery disease in an other family member; Heart disease in his brother and father; High blood pressure in his father; Nephrolithiasis in his sister.  ROS:   Please see the history of present illness.     EKGs/Labs/Other Studies Reviewed:    The  following studies were reviewed today:  Echocardiogram 415/2021: Impressions: 1. Left ventricular ejection fraction, by estimation, is 60 to 65%. The  left ventricle has normal function. The left ventricle has no regional  wall motion abnormalities. There is mild left ventricular hypertrophy.  Left ventricular diastolic parameters  are consistent with Grade I diastolic dysfunction (impaired relaxation).  The average left ventricular global longitudinal strain is -25.6 %.  2. Right ventricular systolic function is normal. The right ventricular  size is normal. Tricuspid regurgitation signal is inadequate for assessing  PA pressure.  3. The mitral valve is normal in structure. No evidence of mitral valve  regurgitation. No evidence of mitral stenosis.  4. The aortic valve is grossly normal. Aortic valve regurgitation is not  visualized. Mild aortic valve sclerosis is present, with no evidence of  aortic valve stenosis.  5. The inferior vena cava is normal in size with greater than 50%  respiratory variability, suggesting right atrial pressure of 3 mmHg.   Comparison(s): A prior study was performed on 01/30/15. Comparison made by  report only. No significant change from prior study.   EKG:  EKG ordered today. EKG personally reviewed and demonstrates normal sinus rhythm, rate 63 bpm, with isolated T wave inversions in lead III (seen on prior tracings). Left axis deviation. Normal PR and QRS intervals. QTc 433 ms.  Recent Labs: No results found for requested labs within last 8760 hours.  Recent Lipid Panel    Component Value Date/Time   CHOL  10/31/2008 0350    110        ATP III CLASSIFICATION:  <200     mg/dL   Desirable  200-239  mg/dL   Borderline High  >=240    mg/dL   High          TRIG 43 10/31/2008 0350   HDL 43 10/31/2008 0350   CHOLHDL 2.6 10/31/2008 0350   VLDL  9 10/31/2008 0350   LDLCALC  10/31/2008 0350    58        Total Cholesterol/HDL:CHD Risk Coronary  Heart Disease Risk Table                     Men   Women  1/2 Average Risk   3.4   3.3  Average Risk       5.0   4.4  2 X Average Risk   9.6   7.1  3 X Average Risk  23.4   11.0        Use the calculated Patient Ratio above and the CHD Risk Table to determine the patient's CHD Risk.        ATP III CLASSIFICATION (LDL):  <100     mg/dL   Optimal  100-129  mg/dL   Near or Above                    Optimal  130-159  mg/dL   Borderline  160-189  mg/dL   High  >190     mg/dL   Very High    Physical Exam:    Vital Signs: BP 140/62   Pulse 65   Ht 5' 10"  (1.778 m)   Wt 238 lb 9.6 oz (108.2 kg)   SpO2 95%   BMI 34.24 kg/m     Wt Readings from Last 3 Encounters:  10/11/20 238 lb 9.6 oz (108.2 kg)  03/29/20 245 lb (111.1 kg)  11/08/19 239 lb 12.8 oz (108.8 kg)     General: 85 y.o. male in no acute distress. HEENT: Normocephalic and atraumatic. Sclera clear.  Neck: Supple. No carotid bruits. No JVD. Heart: RRR. Distinct S1 and S2. No murmurs, gallops, or rubs. Radial  pulses 2+ and equal bilaterally. Lungs: No increased work of breathing. Clear to ausculation bilaterally. No wheezes, rhonchi, or rales.  Abdomen: Soft, non-distended, and non-tender to palpation. Bowel sounds present. Extremities: Trace lower extremity edema bilaterally. Skin: Warm and dry. Neuro: Alert and oriented x3. No focal deficits. Psych: Normal affect. Responds appropriately.  Assessment:    1. Coronary artery disease involving native coronary artery of native heart without angina pectoris   2. History of bradycardia   3. Primary hypertension   4. Hyperlipidemia, unspecified hyperlipidemia type   5. Type 2 diabetes mellitus with complication, with long-term current use of insulin (HCC)   6. Stage 3 chronic kidney disease, unspecified whether stage 3a or 3b CKD ([place])     Plan:    CAD s/p CABG Left Side Pain - History of CABG in 1996 with subsequent stenting. Most recently underwent DES to  occluded SVG to Diag in 2016.  - He denies any chest pain but does report 2 episodes of left side pain as well as little left arm pain which he attributes to sleeping on that side wrong. No exertional chest pain.  - EKG shows no acute ischemic changes. - No aspirin, beta-blocker, statin. - Symptoms above sound atypical. Did discuss that this could be an anginal equivalent and discussed need for possible nuclear stress test if these symptoms continue or he has chest pain or worsening shortness of breath. I think we can hold off on stress test for now but patient will let us know if he develops new or worsening symptoms.  History of Bradycardia - Heart rates as low as the 40's in the past but has tolerated low dose Coreg. Unable to tolerate Metoprolol  in the past. - Rates well controlled on current medications. - Continue Coreg 12.68m twice daily.   Hypertension - Initial BP in the office 140/62 but 122/62 on my recheck. Patient keeps a great BP/HR log at home and BP soft at times. He has had multiple reading with systolic BP in the high 848'Jto 90's (and usually <120).  - Current medications: Amlodipine 542mdaily, Losartan 2573maily, Coreg 12.5mg60mice daily, HCTZ 12.5mg 76mly. Also on Lasix 20mg 45my as needed for lower extremity edema. - Will stop Amlodipine. Will continue other home medications.  Hyperlipidemia - Lipid panel in 07/2020: Total Cholesterol 172, Triglycerides 216, HDL 48, LDL 81.  - LDL goal <70 given CAD. - Currently on Lipitor 10mg d87m. Will increase to 20mg da63m - Will repeat lipid panel and LFTs in 2-3 months.  Type 2 Diabetes Mellitus - Hemoglobin A1c 7.2% in 07/2020. - On Insulin and Alogliptin.  - Management per PCP.  CKD Stage III - Creatinine 1.96 in 07/2020.  - PCP has referred patient to Nephrology.  Disposition: Follow up in 4-6 months.   Of note, Patient is a patient of Dr. ChristopHarrell Gaver[facility]rell [facility]aveng to [facility] however,  [facility], he has request that Dr. Harding Ellyn Hacknew primary Cardiologist.   Medication Adjustments/Labs and Tests Ordered: Current medicines are reviewed at length with the patient today.  Concerns regarding medicines are outlined above.  Orders Placed This Encounter  Procedures  . Lipid panel  . Hepatic function panel   Meds ordered this encounter  Medications  . atorvastatin (LIPITOR) 20 MG tablet    Sig: Take 1 tablet (20 mg total) by mouth daily.    Dispense:  90 tablet    Refill:  3    Patient Instructions  Medication Instructions:  Stop Amlodipine  Increase Lipitor 20 mg daily   *If you need a refill on your cardiac medications before your next appointment, please call your pharmacy*   Lab Work: LIPID/LIVER in 2 months   If you have labs (blood work) drawn today and your tests are completely normal, you will receive your results only by: . MyCharMarland Kitchen Message (if you have MyChart) OR . A paper copy in the mail If you have any lab test that is abnormal or we need to change your treatment, we will call you to review the results.   Follow-Up: At [facility] your health needs are our priority.  As part of our continuing mission to provide you with exceptional heart care, we have created designated Provider Care Teams.  These Care Teams include your primary Cardiologist (physician) and Advanced Practice Providers (APPs -  Physician Assistants and Nurse Practitioners) who all work together to provide you with the care you need, when you need it.  We recommend signing up for the patient portal called "MyChart".  Sign up information is provided on this After Visit Summary.  MyChart is used to connect with patients for Virtual Visits (Telemedicine).  Patients are able to view lab/test results, encounter notes, upcoming appointments, etc.  Non-urgent messages can be sent to your provider as well.   To learn more about what you  can do with MyChart, go to [email or web address] next appointment:   4 month(s)  The format for your next appointment:   In Person  Provider:   David HaGlenetta Hewitching from Dr.ChrisBell Arthurt [place] [facility] [place] [facility]  Signed, Dale Mclean, PA-C  10/11/2020 8:49 AM    [facility]

## 2020-10-11 ENCOUNTER — Other Ambulatory Visit: Payer: Self-pay

## 2020-10-11 ENCOUNTER — Ambulatory Visit (INDEPENDENT_AMBULATORY_CARE_PROVIDER_SITE_OTHER): Payer: Medicare Other | Admitting: Student

## 2020-10-11 ENCOUNTER — Encounter: Payer: Self-pay | Admitting: Student

## 2020-10-11 VITALS — BP 140/62 | HR 65 | Ht 70.0 in | Wt 238.6 lb

## 2020-10-11 DIAGNOSIS — Z794 Long term (current) use of insulin: Secondary | ICD-10-CM

## 2020-10-11 DIAGNOSIS — E785 Hyperlipidemia, unspecified: Secondary | ICD-10-CM | POA: Diagnosis not present

## 2020-10-11 DIAGNOSIS — I2510 Atherosclerotic heart disease of native coronary artery without angina pectoris: Secondary | ICD-10-CM | POA: Diagnosis not present

## 2020-10-11 DIAGNOSIS — I10 Essential (primary) hypertension: Secondary | ICD-10-CM | POA: Diagnosis not present

## 2020-10-11 DIAGNOSIS — Z87898 Personal history of other specified conditions: Secondary | ICD-10-CM | POA: Diagnosis not present

## 2020-10-11 DIAGNOSIS — N183 Chronic kidney disease, stage 3 unspecified: Secondary | ICD-10-CM

## 2020-10-11 DIAGNOSIS — E118 Type 2 diabetes mellitus with unspecified complications: Secondary | ICD-10-CM

## 2020-10-11 MED ORDER — ATORVASTATIN CALCIUM 20 MG PO TABS: 20.0000 mg | ORAL_TABLET | Freq: Every day | ORAL | 3 refills | Status: AC

## 2020-10-11 NOTE — Addendum Note (Signed)
Addended by: Caprice Beaver T on: 10/11/2020 01:51 PM   Modules accepted: Orders

## 2020-10-11 NOTE — Patient Instructions (Signed)
Medication Instructions:  Stop Amlodipine  Increase Lipitor 20 mg daily   *If you need a refill on your cardiac medications before your next appointment, please call your pharmacy*   Lab Work: LIPID/LIVER in 2 months   If you have labs (blood work) drawn today and your tests are completely normal, you will receive your results only by: Marland Kitchen MyChart Message (if you have MyChart) OR . A paper copy in the mail If you have any lab test that is abnormal or we need to change your treatment, we will call you to review the results.   Follow-Up: At [facility], you and your health needs are our priority.  As part of our continuing mission to provide you with exceptional heart care, we have created designated Provider Care Teams.  These Care Teams include your primary Cardiologist (physician) and Advanced Practice Providers (APPs -  Physician Assistants and Nurse Practitioners) who all work together to provide you with the care you need, when you need it.  We recommend signing up for the patient portal called "MyChart".  Sign up information is provided on this After Visit Summary.  MyChart is used to connect with patients for Virtual Visits (Telemedicine).  Patients are able to view lab/test results, encounter notes, upcoming appointments, etc.  Non-urgent messages can be sent to your provider as well.   To learn more about what you can do with MyChart, go to [email or web address].    Your next appointment:   4 month(s)  The format for your next appointment:   In Person  Provider:   Glenetta Hew, MD (switching from [place]- did not want to go to [place])

## 2020-12-10 LAB — HEPATIC FUNCTION PANEL
ALT: 6 IU/L (ref 0–44)
AST: 22 IU/L (ref 0–40)
Albumin: 4.5 g/dL (ref 3.6–4.6)
Alkaline Phosphatase: 63 IU/L (ref 44–121)
Bilirubin Total: 0.4 mg/dL (ref 0.0–1.2)
Bilirubin, Direct: 0.13 mg/dL (ref 0.00–0.40)
Total Protein: 7.3 g/dL (ref 6.0–8.5)

## 2020-12-10 LAB — LIPID PANEL
Chol/HDL Ratio: 4.3 ratio (ref 0.0–5.0)
Cholesterol, Total: 170 mg/dL (ref 100–199)
HDL: 40 mg/dL (ref >39)
LDL Chol Calc (NIH): 95 mg/dL (ref 0–99)
Triglycerides: 205 mg/dL — ABNORMAL HIGH (ref 0–149)
VLDL Cholesterol Cal: 35 mg/dL (ref 5–40)

## 2021-02-11 ENCOUNTER — Ambulatory Visit (INDEPENDENT_AMBULATORY_CARE_PROVIDER_SITE_OTHER): Payer: Medicare Other | Admitting: Cardiology

## 2021-02-11 ENCOUNTER — Other Ambulatory Visit: Payer: Self-pay

## 2021-02-11 ENCOUNTER — Encounter: Payer: Self-pay | Admitting: Cardiology

## 2021-02-11 VITALS — BP 132/78 | HR 64 | Ht 70.0 in | Wt 230.0 lb

## 2021-02-11 DIAGNOSIS — I2510 Atherosclerotic heart disease of native coronary artery without angina pectoris: Secondary | ICD-10-CM | POA: Diagnosis not present

## 2021-02-11 DIAGNOSIS — Z9861 Coronary angioplasty status: Secondary | ICD-10-CM | POA: Diagnosis not present

## 2021-02-11 DIAGNOSIS — Z87898 Personal history of other specified conditions: Secondary | ICD-10-CM

## 2021-02-11 DIAGNOSIS — I10 Essential (primary) hypertension: Secondary | ICD-10-CM | POA: Diagnosis not present

## 2021-02-11 DIAGNOSIS — E1169 Type 2 diabetes mellitus with other specified complication: Secondary | ICD-10-CM | POA: Diagnosis not present

## 2021-02-11 DIAGNOSIS — E785 Hyperlipidemia, unspecified: Secondary | ICD-10-CM

## 2021-02-11 NOTE — Patient Instructions (Signed)
Medication Instructions:  Your physician recommends that you continue on your current medications as directed. Please refer to the Current Medication list given to you today.  *If you need a refill on your cardiac medications before your next appointment, please call your pharmacy*   Lab Work: Please send your labs from the [place].   If you have labs (blood work) drawn today and your tests are completely normal, you will receive your results only by: [place] (if you have MyChart) OR A paper copy in the mail If you have any lab test that is abnormal or we need to change your treatment, we will call you to review the results.   Testing/Procedures: none   Follow-Up: At [facility], you and your health needs are our priority.  As part of our continuing mission to provide you with exceptional heart care, we have created designated Provider Care Teams.  These Care Teams include your primary Cardiologist (physician) and Advanced Practice Providers (APPs -  Physician Assistants and Nurse Practitioners) who all work together to provide you with the care you need, when you need it.  We recommend signing up for the patient portal called "MyChart".  Sign up information is provided on this After Visit Summary.  MyChart is used to connect with patients for Virtual Visits (Telemedicine).  Patients are able to view lab/test results, encounter notes, upcoming appointments, etc.  Non-urgent messages can be sent to your provider as well.   To learn more about what you can do with MyChart, go to [email or web address].    Your next appointment:   6 month(s)  The format for your next appointment:   In Person  Provider:   Glenetta Hew, MD   Other Instructions none

## 2021-02-11 NOTE — Progress Notes (Signed)
Primary Care Provider: [facility]  - Dale Haynes) [place]. [place]. [place] [address] Cardiologist: Dale Dresser, MD -> requested change to Dale Hew, MD Electrophysiologist: None  Clinic Note: Chief Complaint  Patient presents with   Follow-up    4-6 weeks.   Headache   Shortness of Breath   Coronary Artery Disease    No "angina" ; stable DOE --> he admits to being Sedentary -- "I am 85 y/o -- don't feel like rushing around anymore"   ====================================  ASSESSMENT/PLAN   Problem List Items Addressed This Visit       Cardiology Problems   CABG '96, SVG-RCA BMS 2010, DES to native Dx this 12/02/11 (Chronic)    History of CABG.  He has had PCI to vein graft to the RCA and then PCI to the native diagonal when the occluded vein graft to diagonal was visualized. No recurrent symptoms since 2013.  He has not had any stress testing since 2013, after discussion, we will hold off on surveillance testing given his advanced age.  Would only test if symptoms warrant.      Essential hypertension (Chronic)    His blood pressure is pretty well controlled today, but has been up and down in the past.  Currently on a pretty high dose of carvedilol for his advanced age along with HCTZ.  He is only on low-dose losartan.  With worsening renal function he has been referred to nephrology.  I will defer adjusting HCTZ and losartan to his [place] nephrologist for now.  Would be acceptable to reduce carvedilol dose if desired in order to titrate ARB.      Coronary artery disease involving native heart without angina pectoris - Primary (Chronic)    He really does not have true anginal symptoms.  He has musculoskeletal type chest pains but has not had any angina since his PCI in 2013.  Essentially graft dependent with LIMA to LAD and SVG to OM still patent.  The graft to the RCA has a stent in it, and the graft to  the diagonal is occluded.  The native diagonal and upstream LAD segment are patent.  The remaining circumflex with OM1 and AV groove distribution were relatively free of disease.  He has not had an ischemic evaluation in some time.  In the absence of symptoms, I am reluctant to pursue any testing.  We talked about it and he would not be interested in invasive testing unless indicated by symptoms therefore we opted to avoid follow-up stress testing.  Can discuss in future.  Plan:  Continue aspirin and current dose of statin. Okay to hold aspirin as needed for procedures.  Can be 5 to 7 days depending on the provider. He is on a pretty high dose of carvedilol at 12.5 mg twice daily. ->  He has had issues with blood pressure being up.   We will hold on current dose for now, but low threshold to reduce at least his daytime dose, and increase losartan as long as his renal function is stable. Continue current dose of losartan but low threshold to increase as noted. Hold off on further testing unless symptoms warrant.         Other   Hyperlipidemia associated with type 2 diabetes mellitus ([place]) (Chronic)    Last physical labs from July of this year showed LDL of 95.  Lets follow-up from March labs from PCP.  As an addendum  to the initial evaluation, he did have labs checked earlier this month that did show point Care Everywhere after the clinic visit.  LDL was 60.  This was following his increased dose of atorvastatin to 20 mg.  Plan: Continue 20 mg atorvastatin. He is now on insulin.   Has been referred to nephrology.  Will defer to nephrology, but he may benefit from SGLT2 inhibitor for additional diuretic benefit but also renal protection.      History of bradycardia (Chronic)    Stable bradycardia, with him having some underlying fatigue issues I am leery of continuing this high dose.  May consider reducing at least morning dose of carvedilol to allow for titration of losartan if pressures  increase.       ===================================  HPI:    Dale Haynes is a 85 y.o. male with a PMH notable for CAD-(CABG [phone], PCI SVG-RCA 10/2008, PCI native Diag 2/2 ccluded SVG-Diag 12/2011).  He presents today for 80-monthfollow-up and to establish new cardiology care.  He has been followed by Dale Haynes however he did not want to change offices and therefore is scheduled to see me.. 1. Coronary artery disease involving native coronary artery of native heart without angina pectoris  -  CABG 1996 (LIMA-LAD, SVG-Diag1, SVG-OM1, SVG-RCA 2010 - BMS PCI SVG-RCA 4.0 x 18 (Unstable Angina) 2013 - PCI native Diag 2/2 CTO of SVG-Diag- 2016 (Promus DES 2.5 x 12) Echo 09/2019 - EF 60-65% no RWMA. Gr 1 DD Has noted rare episodes of non-exertional chest tightness ; not similar to pre CABG or pre PCI (angina equivalent = numbness/tingling in L arm & chest tightness.  2. History of bradycardia - BUT remains on low dose Coreg  3. Primary hypertension   4. Hyperlipidemia, unspecified hyperlipidemia type   5. Type 2 diabetes mellitus with complication, with long-term current use of insulin (HCC)   6. Stage 3 chronic kidney disease, unspecified whether stage 3a or 3b CKD ([place]   7.  ? PAF - not on DOAC.   Dale BIGGARwas last seen on Oct 11, 2020 by Dale Rives PA.  He was doing well.  Had 2 episodes of mild pain on the left side and lateral aspect of his left arm.  Occurs in the morning and lasts 1 hour.  Probably related to how he was sleeping.  No exertional chest pain.  Chronic exertional dyspnea & LE edema - both stable.  No CHF symptoms. Chest Sx - atypical - thought to be non-anginal. Amlodipine d/c's 2/2 edema Increased atorvastatin to 20 mg  PCP referred to Nephrologist -  Recent Hospitalizations: None  Reviewed  CV studies:    The following studies were reviewed today: (if available, images/films reviewed: From Epic Chart or Care Everywhere) -- [place]  Echocardiogram 415/2021: SEE PSH Cath-BMS PCI RCA for Unstable Angina 2010.  [place]: 100% occlusion of the SVG-Diag1 as the likely culprit for recent unstable angina -> DES PCI prox Native Diag1 lesion with a Promus Element DES 2.5 mm x 12 mm, post-dilated to 2.75 mm. See PSH   Interval History:   Dale PRIOLApresents today today doing pretty well.  He stable.  He does not really notice the chest discomfort episodes anymore.  They come and go but nothing that bothers him.  Nothing that was similar to his pre-PCI ended up pain.  He acknowledges that he is pretty sedentary, and does not really exercise that much as such,  he does have deconditioning and will get short of breath if he overdoes it.  He will get short of breath simply just walking around the house, but try to go up steps or carry groceries makes him a little bit short of breath.  He also has musculoskeletal arthritis pains that limit him.  He has not had any episodes where he is needed to take any additional aspirin or nitroglycerin for chest pain. He takes daily furosemide, but has not used any additional dosing.  CV Review of Symptoms (Summary) Cardiovascular ROS: positive for - dyspnea on exertion, edema, and baseline exercise intolerance due to deconditioning.  He has had intentional weight loss. negative for - chest pain, irregular heartbeat, orthopnea, palpitations, paroxysmal nocturnal dyspnea, rapid heart rate, shortness of breath, or lightheadedness, dizziness or wooziness, syncope/near syncope or TIA/amaurosis fugax, claudication  REVIEWED OF SYSTEMS   Review of Systems  Constitutional:  Positive for weight loss (Has been trying to eat more healthy and be a little more active.). Negative for malaise/fatigue (Just does not do much.  He says he is sedentary, and deconditioned.  Trying to exercise more and watch his diet though.).  HENT:  Negative for congestion and nosebleeds.    Cardiovascular:  Positive for leg swelling (Stable). Negative for chest pain.  Gastrointestinal:  Negative for blood in stool and melena.       Intermittent mild constipation  Genitourinary:  Positive for frequency (Nocturia). Negative for dysuria and hematuria.       Trouble with urinating, keeps him up at night-has prostate exam pending.-Referred to urology.  Musculoskeletal:  Positive for joint pain (Arthritis pains.). Negative for falls and myalgias.  Neurological:  Negative for dizziness, focal weakness and weakness.  Psychiatric/Behavioral:  Positive for memory loss (Some fine details.). The patient does not have insomnia (Just does not sleep well because of nocturia.).    I have reviewed and (if needed) personally updated the patient's problem list, medications, allergies, past medical and surgical history, social and family history.   PAST MEDICAL HISTORY   Past Medical History:  Diagnosis Date   Arthritis    "neck; hands"   BPH (benign prostatic hyperplasia)    Bursitis of shoulder, right    "I've taken alot of shots for that"   Coronary artery disease involving native coronary artery & bypass Graft 1996   a) 1996 CABG x4 (LIMA-LAD, SVG-Diag1, SVG-OM1, SVG-RCA) ->  10/2008: BMS PCI SVG-RCA 4.0 mm x 18 mm -> 4.6 mm); c) 12/2011 - SVG-Diag1 occluded -- DES PCI native Diag 1 (Promus DES 2.5 x 12)   Exertional dyspnea    GERD (gastroesophageal reflux disease)    Gout    Gout of big toe    "left"   Headache(784.0)    "not a routine thing"   Hyperlipidemia LDL goal < 70    Hypertension associated with diabetes ([place])    Kidney stones    "lots"   Paroxysmal atrial fibrillation ([place])    Skin cancer    "face, ears, head, arms"    PAST SURGICAL HISTORY   Past Surgical History:  Procedure Laterality Date   APPENDECTOMY  06/02/1957   CHOLECYSTECTOMY  02/01/1979   CORONARY ANGIOPLASTY WITH STENT PLACEMENT  12/02/2011   Successful PCI of theproximal Native Diag1 lesion with a  Promus Element DES 2.5 mm x 12 mm, post-dilated to 2.75 mm.   CORONARY ARTERY BYPASS GRAFT  06/02/1994   LIMA-LAD, SVG-DIAG1, SVG-OM1, SVG-RCA   CORONARY STENT INTERVENTION  10/2008  Untable Angina  (Dr. Rex Kras) - > 80% m SVG-RCA --> BMS PCI - 4.0 x 18 -> postdilated 4.6 mm   INGUINAL HERNIA REPAIR  1959; ~1963   right; left   LEFT HEART CATHETERIZATION WITH CORONARY/GRAFT ANGIOGRAM N/A 12/02/2011   Procedure: LEFT HEART CATHETERIZATION WITH Beatrix Fetters;  Surgeon: Leonie Man, MD;  Location[facility] C[facility] LAB;  Service: Cardiovascular;  Recent 100% SVG-Diag --> DES PCI.  Patent SVG-OM & LIMA-LAD, patent SVG-RCA w/ stent. LAD 80-90% after D2 - diffuse disease elsewhere - competitive flow from LIMA. Grafted Diag - 75-80% prox.  OM2 - CTO. 100% dRCA (PDA & PL fill via SVG).   SKIN CANCER EXCISION     multiple   TRANSTHORACIC ECHOCARDIOGRAM  09/15/2019   EF 60 to 65%.  No R WMA.  GR 1 DD.  Normal RV size/function.  Unable to assess PAP.  Mild aortic valve sclerosis but no stenosis.  Normal RAP.  No significant change from 01/30/2015.    Immunization History  Administered Date(s) Administered   Influenza,inj,Quad PF,6+ Mos 02/15/2019   Pneumococcal-Unspecified 02/01/2011    MEDICATIONS/ALLERGIES   Current Meds  Medication Sig   acetaminophen (TYLENOL) 650 MG CR tablet Take 650 mg by mouth every 8 (eight) hours as needed for pain.   allopurinol (ZYLOPRIM) 300 MG tablet    Alogliptin Benzoate 12.5 MG TABS Take by mouth.   aspirin EC 81 MG tablet Take 81 mg by mouth daily.   atorvastatin (LIPITOR) 20 MG tablet Take 1 tablet (20 mg total) by mouth daily.   Calcium Carbonate-Vit D-Min 600-400 MG-UNIT TABS Take 1 tablet by mouth 2 (two) times daily.   carvedilol (COREG) 25 MG tablet Take 12.5 mg by mouth 2 (two) times daily with a meal.   DICLOFENAC PO Apply 1 % topically 3 (three) times daily.   famotidine (PEPCID) 20 MG tablet Take 20 mg by mouth 2 (two) times daily.   finasteride  (PROSCAR) 5 MG tablet Take 5 mg by mouth daily.   furosemide (LASIX) 20 MG tablet Take 20 mg by mouth once as needed. fluid   gabapentin (NEURONTIN) 100 MG capsule Take 100 mg by mouth 2 (two) times daily.   Glucosamine HCl 1500 MG TABS Take 1 tablet by mouth every morning.   glucose blood (FREESTYLE LITE) test strip 1 each by Other route as needed for other. Use as instructed   glucose blood (PRECISION XTRA TEST STRIPS) test strip 1 each by Other route as needed for other. Use as instructed   guaifenesin (HUMIBID E) 400 MG TABS tablet Take 400 mg by mouth every 4 (four) hours.   hydrochlorothiazide (HYDRODIURIL) 25 MG tablet Take 12.5 mg by mouth daily.   insulin glargine (LANTUS) 100 UNIT/ML injection Inject 30 Units into the skin at bedtime. Based on blood sugar   Insulin Pen Needle (PEN NEEDLES) 31G X 8 MM MISC by Does not apply route.   losartan (COZAAR) 50 MG tablet Take 25 mg by mouth daily.   Magnesium Oxide 420 (252 Mg) MG TABS Take 1 tablet by mouth.   METHYL SALICYLATE-LIDO-MENTHOL EX Apply 10-15 % topically. Apply small amount to affected area three times a day   Multiple Vitamin (MULTIVITAMIN) capsule Take 1 capsule by mouth daily.   nitroGLYCERIN (NITROSTAT) 0.4 MG SL tablet Place 0.4 mg under the tongue every 5 (five) minutes as needed. For chest pain   pantoprazole (PROTONIX) 40 MG tablet Take 40 mg by mouth daily.   polyethylene glycol (MIRALAX /  GLYCOLAX) packet Take 17 g by mouth daily as needed. Constipation   SYSTANE BALANCE 0.6 % SOLN    [DISCONTINUED] acetaminophen (TYLENOL) 500 MG tablet     Allergies  Allergen Reactions   Statins Other (See Comments)    "make my legs cramp; can tolerate Lipitor"   Metoprolol Other (See Comments)    bradycardia    SOCIAL HISTORY/FAMILY HISTORY   Reviewed in Epic:  Pertinent findings:  Social History   Tobacco Use   Smoking status: Former    Types: Pipe, Cigars    Quit date: 12/01/1992    Years since quitting: 28.2    Smokeless tobacco: Never  Substance Use Topics   Alcohol use: No   Drug use: No   Social History   Social History Narrative   He is retired from [employer], having served 26 years.     Married.    OBJCTIVE -PE, EKG, labs   Wt Readings from Last 3 Encounters:  02/11/21 230 lb (104.3 kg)  10/11/20 238 lb 9.6 oz (108.2 kg)  03/29/20 245 lb (111.1 kg)  --> Has been trying to be a little more active and is watching his diet.  Physical Exam: BP 132/78 (BP Location: Left Arm, Patient Position: Sitting, Cuff Size: Large)   Pulse 64   Ht _0  (1.778 m)   Wt 230 lb (104.3 kg)   BMI 33.00 kg/m  Physical Exam Vitals reviewed.  Constitutional:      General: He is not in acute distress.    Appearance: He is well-developed. He is obese. He is not ill-appearing or toxic-appearing.     Comments: Very pleasant elderly gentleman.  Somewhat hard of hearing.  Mildly obese.  Well-groomed.  No acute distress.  HENT:     Head: Normocephalic and atraumatic.     Ears:     Comments: Quite hard of hearing. Eyes:     Extraocular Movements: Extraocular movements intact.  Neck:     Vascular: No carotid bruit or JVD.  Cardiovascular:     Rate and Rhythm: Regular rhythm. Bradycardia present. No extrasystoles are present.    Chest Wall: PMI is not displaced.     Pulses: Decreased pulses (Decreased, palpable pedal pulses.  1+).     Heart sounds: S1 normal and S2 normal. Heart sounds are distant. Murmur (1-2/6 SEM at RUSB.-Neck.) heard.    No friction rub. No gallop.     Comments: Heart rate was 52 on exam Pulmonary:     Effort: Pulmonary effort is normal. No respiratory distress.     Breath sounds: Normal breath sounds. No wheezing, rhonchi or rales.  Abdominal:     General: Bowel sounds are normal. There is no distension.     Palpations: Abdomen is soft. There is no mass.     Tenderness: There is no abdominal tenderness.  Musculoskeletal:        General: No swelling (1+ pitting edema.). Normal  range of motion.     Cervical back: Normal range of motion and neck supple.  Skin:    General: Skin is warm and dry.  Neurological:     Mental Status: He is alert.  Psychiatric:        Mood and Affect: Mood normal. Mood is not depressed.        Speech: Speech normal.        Behavior: Behavior normal.      Adult ECG Report Not checked  Recent Labs:    Lab Results  Component Value Date   CHOL 170 12/10/2020   HDL 40 12/10/2020   LDLCALC 95 12/10/2020   TRIG 205 (H) 12/10/2020   CHOLHDL 4.3 12/10/2020   08/09/2020 at PCP's Office: - Lipid panel: Total Cholesterol 172, Triglycerides 216, HDL 48, LDL 81.  - Hemoglobin A1c 7.2%.  - CBC: WBC 7.8, Hgb 14.7, Plts 258. - CMET: Na 136, K 4.0, Glucose 196, Cr 1.96, BUN 25, AST 20, ALT 13, Alk Phos 65, Total Bili 0.9, Direct Bili 0.2.  - TSH 1.47.  - Iron panel: Iron 124, TIBC 318, Ferritin 121.4, Iron Sat 39, Transferrin 227.  ==================================================  COVID-19 Education: The signs and symptoms of COVID-19 were discussed with the patient and how to seek care for testing (follow up with PCP or arrange E-visit).    I spent a total of 34 minutes with the patient spent in direct patient consultation.  Additional time spent with chart review  / charting (studies, outside notes, etc): 24 min --> Since he is new to me since cardiac catheterization in 2013, I have updated his medical history and surgical history with pertinent reports.  Cath reports and images reviewed. Total Time: 58 min  Current medicines are reviewed at length with the patient today.  (+/- concerns) N/A  This visit occurred during the SARS-CoV-2 public health emergency.  Safety protocols were in place, including screening questions prior to the visit, additional usage of staff PPE, and extensive cleaning of exam room while observing appropriate contact time as indicated for disinfecting solutions.  Notice: This dictation was prepared with Dragon  dictation along with smaller phrase technology. Any transcriptional errors that result from this process are unintentional and may not be corrected upon review.  Patient Instructions / Medication Changes & Studies & Tests Ordered   Patient Instructions  Medication Instructions:  Your physician recommends that you continue on your current medications as directed. Please refer to the Current Medication list given to you today.  *If you need a refill on your cardiac medications before your next appointment, please call your pharmacy*   Lab Work: Please send your labs from the [place].   If you have labs (blood work) drawn today and your tests are completely normal, you will receive your results only by: [place] (if you have MyChart) OR A paper copy in the mail If you have any lab test that is abnormal or we need to change your treatment, we will call you to review the results.   Testing/Procedures: none   Follow-Up: At [facility], you and your health needs are our priority.  As part of our continuing mission to provide you with exceptional heart care, we have created designated Provider Care Teams.  These Care Teams include your primary Cardiologist (physician) and Advanced Practice Providers (APPs -  Physician Assistants and Nurse Practitioners) who all work together to provide you with the care you need, when you need it.  We recommend signing up for the patient portal called "MyChart".  Sign up information is provided on this After Visit Summary.  MyChart is used to connect with patients for Virtual Visits (Telemedicine).  Patients are able to view lab/test results, encounter notes, upcoming appointments, etc.  Non-urgent messages can be sent to your provider as well.   To learn more about what you can do with MyChart, go to [email or web address].    Your next appointment:   6 month(s)  The format for your next appointment:   In Person  Provider:   Shanon Brow  Ellyn Hack,  MD   Other Instructions none   Studies Ordered:   No orders of the defined types were placed in this encounter.  ADDENDUM - LABS [facility]: 02/18/2021 CREATININE,PLASMA (in mg/dL) 1.95 H  0.6-1.4  UREA NITROGEN 34  H 5-23  GLUCOSE 130  H 74-118  SODIUM 136    134.0-146.0  POTASSIUM 4.3    3.4-5.0  CHLORIDE 102    99-109  CO2 26    22.0-32.0  CALCIUM 10.3    8.5-10.5  eGFR_CKD 34  L 60-Variable   02/06/2021  TSH 1.45  0.34-3.77    HGB A1C 7.6  H 4.1-6.5   CHOLESTEROL 129 50-200  TRIGLYCERIDES 120  30-200  HDL CHOLESTEROL 45  29.0-71.0  CALC LDL-CHOLESTEROL 60  60-130   WBC 10.24  H 4.87-9.47  RBC 4.51    4.38-5.43  HGB 14.2    13.1-16.0  HCT 42.5    40.5-48.4  MCHC 33.4    31.9-33.5  PLATELET COUNT 355    150-400    Dale Haynes, M.D., M.S. Interventional Cardiologist   Pager # [phone] Phone # [phone] [address] [place], [place] [address]   Thank you for choosing Heartcare at [facility]!!

## 2021-02-19 ENCOUNTER — Encounter: Payer: Self-pay | Admitting: Cardiology

## 2021-02-19 NOTE — Assessment & Plan Note (Addendum)
Last physical labs from July of this year showed LDL of 95.  Lets follow-up from March labs from PCP.  As an addendum to the initial evaluation, he did have labs checked earlier this month that did show point Care Everywhere after the clinic visit.  LDL was 60.  This was following his increased dose of atorvastatin to 20 mg.  Plan: Continue 20 mg atorvastatin. He is now on insulin.   Has been referred to nephrology.  Will defer to nephrology, but he may benefit from SGLT2 inhibitor for additional diuretic benefit but also renal protection.

## 2021-02-19 NOTE — Assessment & Plan Note (Signed)
Stable bradycardia, with him having some underlying fatigue issues I am leery of continuing this high dose.  May consider reducing at least morning dose of carvedilol to allow for titration of losartan if pressures increase.

## 2021-02-19 NOTE — Assessment & Plan Note (Signed)
History of CABG.  He has had PCI to vein graft to the RCA and then PCI to the native diagonal when the occluded vein graft to diagonal was visualized. No recurrent symptoms since 2013.  He has not had any stress testing since 2013, after discussion, we will hold off on surveillance testing given his advanced age.  Would only test if symptoms warrant.

## 2021-02-19 NOTE — Assessment & Plan Note (Signed)
He really does not have true anginal symptoms.  He has musculoskeletal type chest pains but has not had any angina since his PCI in 2013.  Essentially graft dependent with LIMA to LAD and SVG to OM still patent.  The graft to the RCA has a stent in it, and the graft to the diagonal is occluded.  The native diagonal and upstream LAD segment are patent.  The remaining circumflex with OM1 and AV groove distribution were relatively free of disease.  He has not had an ischemic evaluation in some time.  In the absence of symptoms, I am reluctant to pursue any testing.  We talked about it and he would not be interested in invasive testing unless indicated by symptoms therefore we opted to avoid follow-up stress testing.  Can discuss in future.  Plan:   Continue aspirin and current dose of statin.  Okay to hold aspirin as needed for procedures.  Can be 5 to 7 days depending on the provider.  He is on a pretty high dose of carvedilol at 12.5 mg twice daily. ->  He has had issues with blood pressure being up.    We will hold on current dose for now, but low threshold to reduce at least his daytime dose, and increase losartan as long as his renal function is stable.  Continue current dose of losartan but low threshold to increase as noted.  Hold off on further testing unless symptoms warrant.

## 2021-02-19 NOTE — Assessment & Plan Note (Signed)
His blood pressure is pretty well controlled today, but has been up and down in the past.  Currently on a pretty high dose of carvedilol for his advanced age along with HCTZ.  He is only on low-dose losartan.  With worsening renal function he has been referred to nephrology.  I will defer adjusting HCTZ and losartan to his [place] nephrologist for now.  Would be acceptable to reduce carvedilol dose if desired in order to titrate ARB.

## 2021-05-22 NOTE — Progress Notes (Signed)
Lab results from 02/06/2021: Already. Na+ 136, K+ 4.2, Cl- 105, HCO3-26, BUN 38, Cr 2.1 (stable-refer to nephrology), Glu 135, Ca2+ 10.1; AST 24, ALT 14, AlkP 84 CBC: W 10.24, H/H 14.2/42.5, Plt 255 TC 129, TG 120, HDL 45, LDL  60--> indicative of improved lipids with higher dose of atorvastatin; acknowledged by PCP. A1c 7.6, TSH 1.45.   Glenetta Hew, MD

## 2021-08-26 ENCOUNTER — Other Ambulatory Visit: Payer: Self-pay

## 2021-08-26 ENCOUNTER — Ambulatory Visit (INDEPENDENT_AMBULATORY_CARE_PROVIDER_SITE_OTHER): Payer: Medicare Other | Admitting: Cardiology

## 2021-08-26 ENCOUNTER — Encounter: Payer: Self-pay | Admitting: Cardiology

## 2021-08-26 VITALS — BP 148/82 | HR 60 | Ht 70.0 in | Wt 235.6 lb

## 2021-08-26 DIAGNOSIS — I9589 Other hypotension: Secondary | ICD-10-CM

## 2021-08-26 DIAGNOSIS — Z9861 Coronary angioplasty status: Secondary | ICD-10-CM

## 2021-08-26 DIAGNOSIS — I2510 Atherosclerotic heart disease of native coronary artery without angina pectoris: Secondary | ICD-10-CM

## 2021-08-26 DIAGNOSIS — E785 Hyperlipidemia, unspecified: Secondary | ICD-10-CM

## 2021-08-26 DIAGNOSIS — E1169 Type 2 diabetes mellitus with other specified complication: Secondary | ICD-10-CM

## 2021-08-26 DIAGNOSIS — I10 Essential (primary) hypertension: Secondary | ICD-10-CM

## 2021-08-26 NOTE — Patient Instructions (Addendum)
Medication Instructions:  ? ? If blood pressure is less than  916  systolic  in the morning hold  Losaratn and Hydrochlorothiazide  , if it occurs in the evening - hold taking Carvedilol  ? ?*If you need a refill on your cardiac medications before your next appointment, please call your pharmacy* ? ? ?Lab Work: ?Not needed ? ? ? ?Testing/Procedures: ? ?Not needed ? ?Follow-Up: ?At [facility] you and your health needs are our priority.  As part of our continuing mission to provide you with exceptional heart care, we have created designated Provider Care Teams.  These Care Teams include your primary Cardiologist (physician) and Advanced Practice Providers (APPs -  Physician Assistants and Nurse Practitioners) who all work together to provide you with the care you need, when you need it. ? ?  ? ?Your next appointment:   ?6 month(s) ? ?The format for your next appointment:   ?In Person ? ?Provider:   ?Glenetta Hew, MD  ? ? ? ?

## 2021-08-26 NOTE — Progress Notes (Signed)
? ?Primary [facility] Provider: [facility] ?- Dr. Girtha Hake [facility]) ?[address] ?[place]. [place] [id number] ?Cardiologist: Glenetta Hew, MD ?Electrophysiologist: None ? ?Clinic Note: ?Chief Complaint  ?Patient presents with  ? Follow-up  ?  Scheduled for 47-month-Stable cardiac symptoms.  ? Hospitalization Follow-up  ?  Hospitalized twice for GI bleed.  No recurrent symptoms since February.  Did require 1 unit transfusion.  ([facility]  ? Coronary Artery Disease  ?  No angina  ? ?=================================== ? ?ASSESSMENT/PLAN  ? ?Problem List Items Addressed This Visit   ? ?  ? Cardiology Problems  ? 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  Distant history of CABG followed by PCI to SVG to RCA and native diagonal occluded vein graft to diagonal.  Last episode was before 2013.  No stress test since 2013.  Again we talked about it unless he is actively having worsening symptoms, he does not want to have any tests. ?. ?Asymptomatic.  On aspirin monotherapy.  No Thienopyridine. ? ?Would not plan for surveillance testing unless he has symptoms per his request ?  ?  ? Essential hypertension (Chronic)  ?  His BP is high today, but has been low in the morning.  Very labile pressures. ? ?Certainly would not want to treat her blood pressure with him having hypotension. ?He is on HCTZ, losartan and as well as 25 mg carvedilol which is a big dose. ?Losartan and HCTZ are in the morning and carvedilol twice daily. ? ?  ?  ? Hyperlipidemia associated with type 2 diabetes mellitus (HCC) (Chronic)  ?  [facility].  Most recent LDL was 67. ?Pretty much at goal for his advanced age. ?A1c is 6.1 on a stable regimen. ? ?Plan:  ?Continue current dose of atorvastatin 20 mg for lipid control.  Noted to titrate further. ?Continue GLP-1 agonist (alogliptin) and SGLT2 inhibitor (empagliflozin) along with Lantus insulin for diabetes ?  ?   ? Relevant Medications  ? empagliflozin (JARDIANCE) 25 MG TABS tablet  ? Coronary artery disease involving native heart without angina pectoris - Primary (Chronic)  ?  Stable with no angina.  No real significant heart failure symptoms either. ? ?Plan:  ?Continue carvedilol although the dose has been 12.5 mg twice daily, and we may want to make the morning dose down to 6.25 mg secondary to orthostasis. ?On aspirin and statin for bleeding issues. ?On ARB for afterload reduction. ?On both GLP-1 agonist and SGLT2 inhibitor.  Cardioprotection. ?  ?  ? Relevant Orders  ? EKG 12-Lead (Completed)  ?  ? Other  ? Hypotension, iatrogenic  ?  Labile blood pressures with significant swelling.  Was somewhat symptomatic with a blood pressure of 86/60 this morning.  I have asked that he spread out his meds and take the losartan and HCTZ more at lunchtime.  I wanted to call a water morning for gets up. ?He should check his blood pressure before taking his  med: If BP less than 100 in the morning hold losartan and HCTZ.  If in the evening, simply hold evening dose of carvedilol. ?Maintain adequate hydration.  Avoid diuretic diuresis.  We may want to eventually consider stopping HCTZ. ? ?  ?  ? ? ?=================================== ? ?HPI:   ? ?Dale DAUBERTis a 86y.o. male with a PMH reviewed below who presents today for 674-monthollow-up. ? ?PMH notable for CAD-(CABG [id number]  PCI SVG-RCA 10/2008, PCI native Diag 2/2 ccluded SVG-Diag 12/2011).   ?1. Coronary artery disease involving native coronary artery of native heart without angina pectoris  -  ?CABG 1996 (LIMA-LAD, SVG-Diag1, SVG-OM1, SVG-RCA ?2010 - BMS PCI SVG-RCA 4.0 x 18 (Unstable Angina) ?2013 - PCI native Diag 2/2 CTO of SVG-Diag- 2016 (Promus DES 2.5 x 12) ?Echo 09/2019 - EF 60-65% no RWMA. Gr 1 DD ?Has noted rare episodes of non-exertional chest tightness ; not similar to pre CABG or pre PCI (angina equivalent = numbness/tingling in L arm & chest tightness.  ?2.  History of bradycardia - BUT remains on low dose Coreg  ?3. Primary hypertension   ?4. Hyperlipidemia, unspecified hyperlipidemia type   ?5. Type 2 diabetes mellitus with complication, with long-term current use of insulin (Dale Haynes)   ?6. Stage 3 chronic kidney disease, unspecified whether stage 3a or 3b CKD (Dale Haynes)   ?7.  ? PAF - not on DOAC.  ? ? ?Dale Haynes was last seen on February 11, 2021. He has been followed by Dr. Harrell Gave, however he did not want to change offices and therefore is scheduled to see me.  He was doing fairly well.  Stable.  Not really noticing any chest pain or pressure.  Nothing similar to her pre-PCI pain.  Very sedentary.  Does not do much Xience.  Very deconditioned.  Therefore get somewhat dyspneic with exertion if he overdoes it.  Can get short of breath try to carry groceries or walking up steps.  But not with routine activity.  Limited by musculoskeletal arthritis pains. ?-> No requirement of nitroglycerin or aspirin.  Was taking daily furosemide but no additional doses. ?Decided to avoid surveillance stress testing given his advanced age.  Only in the setting of symptoms.  No medication changes made.Marland Kitchen ?Asked him to discuss with nephrology about SGLT2 inhibitor.  Was on atorvastatin 20 mg but labs not available. ? ?2 relatively recent hospitalizations for GI bleed/anemia. ?Recent Hospitalizations:  ?05/03/2021: [facility]-admitted for hematochezia/lower GI bleed related to diverticulosis.  Also had some melanotic stools.  Hemoglobin dropped to 11 from 13.  Colonoscopy performed revealing large internal hemorrhoids and severe diverticulosis of the sigmoid colon but no overt bleeding. => Placed on pantoprazole.  Capsule endoscopy ordered for February 6.  ?Tagged RBC scan with no evidence of GI bleed on February 5. ?07/06/2021-Back to [facility] again hematochezia.  Noticed 3-4 episodes the previous evening of bloody stools.  Associate with weakness and  dizziness.  No chest pain or dyspnea.  Hemoglobin was 10.6. => Given 1 unit of PRBC with plan for capsule endoscopy.  Aspirin held ? ?Reviewed  CV studies:   ? ?The following studies were reviewed today: (if available, images/films reviewed: From Epic Chart or Care Everywhere) ?None: ? ? ?Interval History:  ? ?Dale Haynes returns here today for follow-up.  We did discuss his hospitalizations.  He has not had any further melena or hematochezia.  He still has some baseline exertional dyspnea.  It got worse when he was anemic, better now that his blood counts are coming up some.  He notes some trivial swelling.  No chest pain or pressure even with anemic.  He got a little squeamish after his GI bleed and has been somewhat " lazy" and out of his exercise routine.  He still walks but does not as much as he used to.  Not doing the routine exercise. ? ?1 thing is concerned about his some hypotension  issues.  Prebreakfast this morning his blood pressure was 86/60 and he felt quite queasy.  After about an hour and eating breakfast his pressures went up to 104/60.  Now they are 140s over 82.  Has had an issue with this labile blood pressure for a while now.  Has not had true syncope or even near syncope, has not been dizzy more so than usual. ? ?No heart failure or anginal symptoms.  No symptoms of palpitations or irregular heartbeats. ? ?He actually was started on Jardiance.  Tolerating well. ? ?CV Review of Symptoms (Summary) ?Cardiovascular ROS: positive for - dyspnea on exertion, shortness of breath, and baseline exercise intolerance.  No real change to exertional dyspnea.  Only time that it was worse was when he was anemic. ?negative for - chest pain, edema, irregular heartbeat, orthopnea, palpitations, paroxysmal nocturnal dyspnea, rapid heart rate, shortness of breath, or syncope/near syncope, just dizziness.  No TIA or amaurosis fugax.  No claudication. ? ?REVIEWED OF SYSTEMS  ? ?Review of Systems   ?Constitutional:  Positive for malaise/fatigue (A little bit less energy than he had before.  Some mild exertional dyspnea and deconditioning.). Negative for weight loss.  ?HENT:  Negative for congestion and no

## 2021-10-02 ENCOUNTER — Encounter: Payer: Self-pay | Admitting: Cardiology

## 2021-10-02 DIAGNOSIS — I9589 Other hypotension: Secondary | ICD-10-CM | POA: Insufficient documentation

## 2021-10-02 NOTE — Assessment & Plan Note (Addendum)
Distant history of CABG followed by PCI to SVG to RCA and native diagonal occluded vein graft to diagonal.  Last episode was before 2013.  No stress test since 2013.  Again we talked about it unless he is actively having worsening symptoms, he does not want to have any tests. ?. ?? Asymptomatic.  On aspirin monotherapy.  No Thienopyridine. ? ?Would not plan for surveillance testing unless he has symptoms per his request ?

## 2021-10-02 NOTE — Assessment & Plan Note (Signed)
Labile blood pressures with significant swelling.  Was somewhat symptomatic with a blood pressure of 86/60 this morning.  I have asked that he spread out his meds and take the losartan and HCTZ more at lunchtime.  I wanted to call a water morning for gets up. ?He should check his blood pressure before taking his  med: If BP less than 100 in the morning hold losartan and HCTZ.  If in the evening, simply hold evening dose of carvedilol. ?Maintain adequate hydration.  Avoid diuretic diuresis.  We may want to eventually consider stopping HCTZ. ?

## 2021-10-02 NOTE — Assessment & Plan Note (Addendum)
Stable with no angina.  No real significant heart failure symptoms either. ? ?Plan:  ?? Continue carvedilol although the dose has been 12.5 mg twice daily, and we may want to make the morning dose down to 6.25 mg secondary to orthostasis. ?? On aspirin and statin for bleeding issues. ?? On ARB for afterload reduction. ?? On both GLP-1 agonist and SGLT2 inhibitor.  Cardioprotection. ?

## 2021-10-02 NOTE — Assessment & Plan Note (Addendum)
[facility].  Most recent LDL was 67. ?Pretty much at goal for his advanced age. ?A1c is 6.1 on a stable regimen. ? ?Plan:  ?? Continue current dose of atorvastatin 20 mg for lipid control.  Noted to titrate further. ?? Continue GLP-1 agonist (alogliptin) and SGLT2 inhibitor (empagliflozin) along with Lantus insulin for diabetes ?

## 2021-10-02 NOTE — Assessment & Plan Note (Addendum)
His BP is high today, but has been low in the morning.  Very labile pressures. ? ?Certainly would not want to treat her blood pressure with him having hypotension. ?He is on HCTZ, losartan and as well as 25 mg carvedilol which is a big dose. ?Losartan and HCTZ are in the morning and carvedilol twice daily. ?

## 2022-02-19 ENCOUNTER — Ambulatory Visit: Payer: Medicare Other | Attending: Cardiology | Admitting: Cardiology

## 2022-02-19 ENCOUNTER — Encounter: Payer: Self-pay | Admitting: Cardiology

## 2022-02-19 VITALS — BP 112/68 | HR 57 | Ht 70.0 in | Wt 236.6 lb

## 2022-02-19 DIAGNOSIS — R001 Bradycardia, unspecified: Secondary | ICD-10-CM | POA: Insufficient documentation

## 2022-02-19 DIAGNOSIS — I9589 Other hypotension: Secondary | ICD-10-CM | POA: Insufficient documentation

## 2022-02-19 DIAGNOSIS — E1169 Type 2 diabetes mellitus with other specified complication: Secondary | ICD-10-CM | POA: Insufficient documentation

## 2022-02-19 DIAGNOSIS — I2510 Atherosclerotic heart disease of native coronary artery without angina pectoris: Secondary | ICD-10-CM | POA: Insufficient documentation

## 2022-02-19 DIAGNOSIS — Z9861 Coronary angioplasty status: Secondary | ICD-10-CM | POA: Diagnosis not present

## 2022-02-19 DIAGNOSIS — I10 Essential (primary) hypertension: Secondary | ICD-10-CM | POA: Diagnosis not present

## 2022-02-19 DIAGNOSIS — E785 Hyperlipidemia, unspecified: Secondary | ICD-10-CM | POA: Insufficient documentation

## 2022-02-19 NOTE — Patient Instructions (Signed)

## 2022-02-19 NOTE — Progress Notes (Signed)
[facility] Provider: [facility] Girtha Hake [facility]) [place]. [place]. [place] [address] [facility] Cardiologist: Glenetta Hew, MD Electrophysiologist: None  Clinic Note: Chief Complaint  Patient presents with   Follow-up    6 months.  Doing well.  PCP stopped HCTZ because of dizziness based on last note.  Started on tamsulosin for urinary issues.   Coronary Artery Disease    No active angina.  Able to do what he wants to do but still has exertional dyspnea that is stable.   ===================================  ASSESSMENT/PLAN   Problem List Items Addressed This Visit       Cardiology Problems   CABG '96, SVG-RCA BMS 2010, DES to native Diag 12/02/11 (Chronic)    Distant history of CABG followed by PCI to the SVG of RCA as well as DES PCI of native Diagonal. No active anginal symptoms.  Has not had a stress test since his final PCI back in 2013.  Per previous discussion, plan is to avoid surveillance testing unless symptoms warrant. Remains active and asymptomatic. On aspirin monotherapy.  No longer on Thienopyridine.      Essential hypertension (Chronic)    He has issues with orthostatic hypotension as well as baseline hypertension.  Currently his pressures a little on the low side.  Over the years, he has had losartan dose dropped now taking 25 mg and has had HCTZ discontinued.  Now dizziness seems to improve with less orthostatic hypotension issues.  Based on recent adjustments, the plan will be to allow for permissive hypertension in the future and not to for tight control.  Stable for now.  Encourage adequate hydration.      Hyperlipidemia associated with type 2 diabetes mellitus ([place]) (Chronic)    Labs from PCP 02/10/2022-pretty much at goal for lipids, but A1c is still little high on current meds..: TC 152, TG 183, HDL 47, LDLc 68; A1c 8.1 Continue current dose of Lipitor along with Jardiance.  Is on  Lantus insulin, defer further titration to PCP.       Relevant Medications   Alogliptin Benzoate 25 MG TABS   Coronary artery disease involving native heart without angina pectoris - Primary (Chronic)    Has not had any active angina since his last PCI in 2013.  Plan:  Continue 81 mg aspirin, 20 mg atorvastatin along with Jardiance. 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  Is able to get his heart rate up with activity.  If this becomes an issue, would cut down morning dose of carvedilol.      Hypotension, iatrogenic (Chronic)    Much better on reduced dose of losartan, and being off HCTZ.  Still if his blood pressure is less than 100, he would hold his losartan dose. For bradycardia or for low blood pressures could consider reducing morning dose of carvedilol to one 6.25 mg      ===================================  HPI:    Dale Haynes is a 86 y.o. male with a PMH below who presents today for 6 month f/u.  He returns today at the request of [place] notable for CAD-(CABG [phone], PCI SVG-RCA 10/2008, PCI native Diag 2/2 ccluded SVG-Diag 12/2011).   1. Coronary artery disease involving native coronary artery  of native heart without angina pectoris  -  CABG 1996 (LIMA-LAD, SVG-Diag1, SVG-OM1, SVG-RCA 2010 - BMS PCI SVG-RCA 4.0 x 18 (Unstable Angina) 2013 - PCI native Diag 2/2 CTO of SVG-Diag- 2016 (Promus DES 2.5 x 12) Echo 09/2019 - EF 60-65% no RWMA. Gr 1 DD Has noted rare episodes of non-exertional chest tightness ; not similar to pre CABG or pre PCI (angina equivalent = numbness/tingling in L arm & chest tightness. Based on previous discussions, decision was made to avoid surveillance stress testing unless symptoms warrant.  2. History of bradycardia - BUT remains on low dose Coreg  3. Primary hypertension => labile blood  pressures.  4. Hyperlipidemia, =>   5. Type 2 diabetes mellitus with complication, with long-term current use of insulin (HCC)  =.  Recently started on Jardiance  6. Stage 3 chronic kidney disease, unspecified whether stage 3a or 3b CKD ([place])   7.  ? PAF - not on DOAC. => Presumably related to recent episodes of hematochezia with lower GI bleed in December 2022 and February 2023    [place] was last seen on August 22, 2021 for hospital follow-up.  Was still noticing some baseline exertional dyspnea.  This was exacerbated by his anemia.  No chest pain or pressure even with anemia lab.  Some trivial swelling.  Following his hospitalizations he got out of the habit of exercise but will still try to do some walking just watch.  Noted some hypotension issues with prebreakfast BPs in the 80s / 60s.  Noted to have labile blood pressures some near syncope/dizziness but no true syncope. Recommendation was to hold losartan and HCTZ if blood pressures are less than 100 in the morning.  Discussed the possibility of actually stopping HCTZ if Orthostatic symptoms became more prominent Continue to atorvastatin along with GLP-1 agonist and SGLT2 inhibitor.  .  From PCP: June 9 started tamsulosin for urinary issues, September 12 discontinued HCTZ for orthostasis.  Recent Hospitalizations: None  Reviewed  CV studies:    The following studies were reviewed today: (if available, images/films reviewed: From Epic Chart or Care Everywhere) none:  Interval History:   CARLEN FILS returns here today overall doing fairly well.  He says that he is much less dyspneic since stopping HCTZ.  Blood pressures have been much more stable.  He actually is also only taking 1/2 tablet of losartan after recent dizziness.  With this change he is doing better. He still tries stay active but has baseline dyspnea notably increases with walking-usually it is when he is walking up stairs or up a hill or if he just  goes too far.  Sometimes if he is carrying things.  And does have to occasionally stop.  He does not have any chest pain or pressure just the dyspnea. He tries remember to get up and walk around some that usually helps the swelling.  If he has to stay seated for long period time he will have worsening swelling.  It also goes down when he puts his feet up.  He does acknowledge that he just does not do very much and as such he does not have a lot of energy.  He can do what he wants to do, and is able to get things done that he would like to do just says he is "lazy ". He does have trouble with his right foot with swelling and tenderness.  No longer on DOAC (stopped about 3 years ago because of GI  bleed.  Has not had any further GI bleed.  CV Review of Symptoms (Summary): positive for - dyspnea on exertion, edema, and improved orthostatics negative for - chest pain, irregular heartbeat, orthopnea, palpitations, paroxysmal nocturnal dyspnea, rapid heart rate, shortness of breath, or syncope/near syncope or TIA/RCVS, claudication  REVIEWED OF SYSTEMS   Review of Systems  Constitutional:  Positive for malaise/fatigue (Mostly deconditioning.  He says he is "lazy "and does not do a lot of activity.  As such, has exertional dyspnea and fatigue but not more than baseline.). Negative for weight loss.  HENT:  Negative for congestion and nosebleeds.   Respiratory:  Negative for cough and shortness of breath.   Cardiovascular:  Positive for leg swelling (Trivial if he does not put his feet up).       Per HPI  Gastrointestinal:  Negative for blood in stool and melena.  Genitourinary:  Positive for frequency (Nocturia). Negative for dysuria and hematuria.  Musculoskeletal:  Positive for back pain and joint pain.  Neurological:  Positive for dizziness (Notably improved orthostatic dizziness.), weakness (Legs continue weak on him every now and then.) and headaches. Negative for focal weakness.   Psychiatric/Behavioral:  Positive for memory loss. Negative for depression. The patient is not nervous/anxious and does not have insomnia.    I have reviewed and (if needed) personally updated the patient's problem list, medications, allergies, past medical and surgical history, social and family history.   PAST MEDICAL HISTORY   Past Medical History:  Diagnosis Date   Arthritis    "neck; hands"   BPH (benign prostatic hyperplasia)    Bursitis of shoulder, right    "I've taken alot of shots for that"   Coronary artery disease involving native coronary artery & bypass Graft 1996   a) 1996 CABG x4 (LIMA-LAD, SVG-Diag1, SVG-OM1, SVG-RCA) ->  10/2008: BMS PCI SVG-RCA 4.0 mm x 18 mm -> 4.6 mm); c) 12/2011 - SVG-Diag1 occluded -- DES PCI native Diag 1 (Promus DES 2.5 x 12)   Exertional dyspnea    GERD (gastroesophageal reflux disease)    Gout    Gout of big toe    "left"   Headache(784.0)    "not a routine thing"   Hyperlipidemia LDL goal < 70    Hypertension associated with diabetes ([place])    Kidney stones    "lots"   Paroxysmal atrial fibrillation ([place])    Skin cancer    "face, ears, head, arms"    PAST SURGICAL HISTORY   Past Surgical History:  Procedure Laterality Date   APPENDECTOMY  06/02/1957   CHOLECYSTECTOMY  02/01/1979   CORONARY ANGIOPLASTY WITH STENT PLACEMENT  12/02/2011   Successful PCI of theproximal Native Diag1 lesion with a Promus Element DES 2.5 mm x 12 mm, post-dilated to 2.75 mm.   CORONARY ARTERY BYPASS GRAFT  06/02/1994   LIMA-LAD, SVG-DIAG1, SVG-OM1, SVG-RCA   CORONARY STENT INTERVENTION  10/2008   Untable Angina  (Dr. Rex Kras) - > 80% m SVG-RCA --> BMS PCI - 4.0 x 18 -> postdilated 4.6 mm   INGUINAL HERNIA REPAIR  1959; ~1963   right; left   LEFT HEART CATHETERIZATION WITH CORONARY/GRAFT ANGIOGRAM N/A 12/02/2011   Procedure: LEFT HEART CATHETERIZATION WITH Beatrix Fetters;  Surgeon: Leonie Man, MD;  [facility] C[facility] LAB;  Service:  Cardiovascular;  Recent 100% SVG-Diag --> DES PCI.  Patent SVG-OM & LIMA-LAD, patent SVG-RCA w/ stent. LAD 80-90% after D2 - diffuse disease elsewhere - competitive flow from LIMA. Grafted Diag -  75-80% prox.  OM2 - CTO. 100% dRCA (PDA & PL fill via SVG).   SKIN CANCER EXCISION     multiple   TRANSTHORACIC ECHOCARDIOGRAM  09/15/2019   EF 60 to 65%.  No R WMA.  GR 1 DD.  Normal RV size/function.  Unable to assess PAP.  Mild aortic valve sclerosis but no stenosis.  Normal RAP.  No significant change from 01/30/2015.    Immunization History  Administered Date(s) Administered   Influenza,inj,Quad PF,6+ Mos 02/15/2019   Pneumococcal-Unspecified 02/01/2011    MEDICATIONS/ALLERGIES   Current Meds  Medication Sig   acetaminophen (TYLENOL) 500 MG tablet Take 500 mg by mouth as needed.   allopurinol (ZYLOPRIM) 300 MG tablet    Alogliptin Benzoate 25 MG TABS Take 25 mg by mouth in the morning.   ascorbic acid (VITAMIN C) 500 MG tablet Take 500 mg by mouth daily.   aspirin EC 81 MG tablet Take 81 mg by mouth daily.   atorvastatin (LIPITOR) 20 MG tablet Take 1 tablet (20 mg total) by mouth daily.   Calcium Carbonate-Vit D-Min 600-400 MG-UNIT TABS Take 1 tablet by mouth 2 (two) times daily.   carvedilol (COREG) 25 MG tablet Take 12.5 mg by mouth 2 (two) times daily with a meal.   dutasteride (AVODART) 0.5 MG capsule Take 0.5 mg by mouth at bedtime.   empagliflozin (JARDIANCE) 25 MG TABS tablet Take 12.5 mg by mouth daily.   FEROSUL 325 (65 Fe) MG tablet Take 1 tablet by mouth daily.   glucose blood (FREESTYLE LITE) test strip 1 each by Other route as needed for other. Use as instructed   glucose blood (PRECISION XTRA TEST STRIPS) test strip 1 each by Other route as needed for other. Use as instructed   hydrochlorothiazide (HYDRODIURIL) 25 MG tablet Take 12.5 mg by mouth daily.   insulin glargine (LANTUS) 100 UNIT/ML injection Inject 30 Units into the skin at bedtime. Based on blood sugar   Insulin  Pen Needle (PEN NEEDLES) 31G X 8 MM MISC by Does not apply route.   losartan (COZAAR) 50 MG tablet Take 25 mg by mouth daily.   Magnesium Oxide 420 (252 Mg) MG TABS Take 1 tablet by mouth.   Multiple Vitamin (MULTIVITAMIN) capsule Take 1 capsule by mouth daily.   pantoprazole (PROTONIX) 40 MG tablet Take 40 mg by mouth daily.   polyethylene glycol (MIRALAX / GLYCOLAX) packet Take 17 g by mouth daily as needed. Constipation   SYSTANE BALANCE 0.6 % SOLN    tamsulosin (FLOMAX) 0.4 MG CAPS capsule Take 0.4 mg by mouth daily.    Allergies  Allergen Reactions   Statins Other (See Comments)    "make my legs cramp; can tolerate Lipitor"   Metoprolol Other (See Comments)    bradycardia    SOCIAL HISTORY/FAMILY HISTORY   Reviewed in Epic:  Pertinent findings:  Social History   Tobacco Use   Smoking status: Former    Types: Pipe, Cigars    Quit date: 12/01/1992    Years since quitting: 29.2   Smokeless tobacco: Never  Substance Use Topics   Alcohol use: No   Drug use: No   Social History   Social History Narrative   He is retired from [employer], having served 26 years.     Married.    OBJCTIVE -PE, EKG, labs   Wt Readings from Last 3 Encounters:  02/19/22 236 lb 9.6 oz (107.3 kg)  08/26/21 235 lb 9.6 oz (106.9 kg)  02/11/21 230  lb (104.3 kg)    Physical Exam: BP 112/68 (BP Location: Left Arm, Patient Position: Sitting, Cuff Size: Large)   Pulse (!) 57   Ht '5\' 10"'$  (1.778 m)   Wt 236 lb 9.6 oz (107.3 kg)   SpO2 97%   BMI 33.95 kg/m  Physical Exam Vitals reviewed.  Constitutional:      General: He is not in acute distress.    Appearance: Normal appearance. He is obese. He is not ill-appearing (Elderly, but well-nourished well-groomed.) or toxic-appearing.  HENT:     Head: Normocephalic and atraumatic.  Cardiovascular:     Rate and Rhythm: Normal rate and regular rhythm. No extrasystoles are present.    Chest Wall: PMI is not displaced.     Pulses: Normal pulses and  intact distal pulses.     Heart sounds: S1 normal and S2 normal. Heart sounds are distant. Murmur (Cannot exclude soft 1/6 SEM, but not very audible.) heard.     Harsh crescendo-decrescendo early systolic murmur is present with a grade of 1/6 at the upper right sternal border radiating to the neck.     No friction rub. No gallop.  Pulmonary:     Effort: Pulmonary effort is normal.     Breath sounds: Normal breath sounds. No wheezing or rales.     Comments: Mild diffuse interstitial sounds.  Otherwise CTA B Musculoskeletal:        General: No swelling. Normal range of motion.  Skin:    General: Skin is warm and dry.  Neurological:     General: No focal deficit present.     Mental Status: He is alert and oriented to person, place, and time.     Gait: Gait abnormal.  Psychiatric:        Mood and Affect: Mood normal.        Behavior: Behavior normal.        Thought Content: Thought content normal.        Judgment: Judgment normal.     Adult ECG Report Not checked  Recent Labs: Annual labs and PCP: 02/10/2022 Na+ 136, K+ 4.6, Cl- 106, HCO3-25, BUN 26, Cr 2.0, Glu 205, Ca2+ 9.0; AST 22, ALT 11, AlkP 75 CBC: W 7.3, H/H 14.1/42, Plt 229; iron 115, TIBC 342, vitamin B-12 617, ferritin 51.5, FE sat 32.7, transferrin 244 TC 152, TG 183, HDL 47, LDLc 68; A1c 8.1; TSH 1.44   Lab Results  Component Value Date   CHOL 170 12/10/2020   HDL 40 12/10/2020   LDLCALC 95 12/10/2020   TRIG 205 (H) 12/10/2020   CHOLHDL 4.3 12/10/2020   Lab Results  Component Value Date   CREATININE 1.27 12/03/2011   BUN 24 (H) 12/03/2011   NA 138 12/03/2011   K 3.7 12/03/2011   CL 101 12/03/2011   CO2 24 12/03/2011      Latest Ref Rng & Units 12/03/2011    4:07 AM 12/02/2011    8:45 AM 11/02/2008    6:20 AM  CBC  WBC 4.0 - 10.5 K/uL 8.4  7.1  6.4   Hemoglobin 13.0 - 17.0 g/dL 13.0  13.4  12.3   Hematocrit 39.0 - 52.0 % 38.4  38.8  36.2   Platelets 150 - 400 K/uL 233  227  158     Lab Results  Component  Value Date   HGBA1C  10/31/2008    6.0 (NOTE) The ADA recommends the following therapeutic goal for glycemic control related to Hgb A1c measurement: Goal of  therapy: <6.5 Hgb A1c  Reference: American Diabetes Association: Clinical Practice Recommendations 2010, Diabetes Care, 2010, 33: (Suppl  1).   ================================================== I spent a total of 35 minutes with the patient spent in direct patient consultation.  Additional time spent with chart review  / charting (studies, outside notes, etc): 18 min Total Time: 53 min  Current medicines are reviewed at length with the patient today.  (+/- concerns) none  Notice: This dictation was prepared with Dragon dictation along with smart phrase technology. Any transcriptional errors that result from this process are unintentional and may not be corrected upon review.  Studies Ordered:   No orders of the defined types were placed in this encounter.  No orders of the defined types were placed in this encounter.   Patient Instructions / Medication Changes & Studies & Tests Ordered   Patient Instructions  Medication Instructions:   No changes   *If you need a refill on your cardiac medications before your next appointment, please call your pharmacy*   Lab Work: Not needed   If you have labs (blood work) drawn today and your tests are completely normal, you will receive your results only by: MyChart Message (if you have MyChart) OR A paper copy in the mail If you have any lab test that is abnormal or we need to change your treatment, we will call you to review the results.   Testing/Procedures:  Not needed  Follow-Up: At [facility], you and your health needs are our priority.  As part of our continuing mission to provide you with exceptional heart care, we have created designated Provider Care Teams.  These Care Teams include your primary Cardiologist (physician) and Advanced Practice Providers (APPs -   Physician Assistants and Nurse Practitioners) who all work together to provide you with the care you need, when you need it.  We recommend signing up for the patient portal called "MyChart".  Sign up information is provided on this After Visit Summary.  MyChart is used to connect with patients for Virtual Visits (Telemedicine).  Patients are able to view lab/test results, encounter notes, upcoming appointments, etc.  Non-urgent messages can be sent to your provider as well.   To learn more about what you can do with MyChart, go to [email or web address].    Your next appointment:   12 month(s)  The format for your next appointment:   In Person  Provider:   Glenetta Hew, MD      Leonie Man, MD, MS Glenetta Hew, M.D., M.S. Interventional Cardiologist  [facility]  Pager # [phone] Phone # [phone] [address] [place], [place] [address]   Thank you for choosing [facility]!!

## 2022-03-02 ENCOUNTER — Encounter: Payer: Self-pay | Admitting: Cardiology

## 2022-03-02 NOTE — Assessment & Plan Note (Addendum)
Current heart rate 57.  Is able to get his heart rate up with activity.  If this becomes an issue, would cut down morning dose of carvedilol.

## 2022-03-02 NOTE — Assessment & Plan Note (Signed)
Much better on reduced dose of losartan, and being off HCTZ.  Still if his blood pressure is less than 100, he would hold his losartan dose. For bradycardia or for low blood pressures could consider reducing morning dose of carvedilol to one 6.25 mg

## 2022-03-02 NOTE — Assessment & Plan Note (Signed)
Has not had any active angina since his last PCI in 2013.  Plan:   Continue 81 mg aspirin, 20 mg atorvastatin along with Jardiance.  Continue Toprol 5 mg twice daily carvedilol and now reduced dose losartan (25 mg from 50 mg)

## 2022-03-02 NOTE — Assessment & Plan Note (Signed)
Labs from PCP 02/10/2022-pretty much at goal for lipids, but A1c is still little high on current meds..: TC 152, TG 183, HDL 47, LDLc 68; A1c 8.1 Continue current dose of Lipitor along with Jardiance.  Is on Lantus insulin, defer further titration to PCP.

## 2022-03-02 NOTE — Assessment & Plan Note (Signed)
He has issues with orthostatic hypotension as well as baseline hypertension.  Currently his pressures a little on the low side.  Over the years, he has had losartan dose dropped now taking 25 mg and has had HCTZ discontinued.  Now dizziness seems to improve with less orthostatic hypotension issues.  Based on recent adjustments, the plan will be to allow for permissive hypertension in the future and not to for tight control.  Stable for now.  Encourage adequate hydration.

## 2022-03-02 NOTE — Assessment & Plan Note (Addendum)
Distant history of CABG followed by PCI to the SVG of RCA as well as DES PCI of native Diagonal. No active anginal symptoms.  Has not had a stress test since his final PCI back in 2013.  Per previous discussion, plan is to avoid surveillance testing unless symptoms warrant. Remains active and asymptomatic. On aspirin monotherapy.  No longer on Thienopyridine.

## 2022-06-03 ENCOUNTER — Telehealth: Payer: Self-pay | Admitting: Cardiology

## 2022-06-03 NOTE — Telephone Encounter (Signed)
  Pt said, he was tested positive for covid on 05/31/22. He was told to call Dr. Ellyn Hack to ask what medication he can take

## 2022-06-03 NOTE — Telephone Encounter (Signed)
Due to patient's renal function and current medications, recommend Lagevrio versus Paxlovid

## 2022-06-03 NOTE — Telephone Encounter (Signed)
Will forward to pharm md to advise 

## 2022-06-03 NOTE — Telephone Encounter (Signed)
Spoke with pt, he reports he does not really want any medication because he is beginning to feel better. He called just because his medical doctor told him to. Aware to call back if needed.

## 2022-11-17 LAB — LAB REPORT - SCANNED
A1c: 7
EGFR: 36

## 2023-02-16 ENCOUNTER — Ambulatory Visit: Payer: Medicare Other | Admitting: Nurse Practitioner

## 2023-03-31 NOTE — Progress Notes (Signed)
Cardiology Office Note:  .   Date:  04/05/2023  ID:  Dale Haynes, DOB 11/09/1935, MRN [id number] PCP: [facility]  [facility] Providers Cardiologist:  Bryan Lemma, MD     Chief Complaint  Patient presents with   Follow-up    Annual follow-up.  Doing well   Coronary Artery Disease    No further angina    Patient Profile: .     Dale Haynes is a 87 y.o. male with a PMH noted below who presents here for annual follow-up at the request of [facility].  PMH notable for CAD-(CABG [phone], PCI SVG-RCA 10/2008, PCI native Diag 2/2 ccluded SVG-Diag 12/2011).       Dale Haynes was last seen on 02/19/2022 overall doing well.  Less dyspneic since stopping HCTZ.  BPs been stable.  Was only taken one half dose of his losartan due to dizziness and feeling better with that.  Staying active but is limited by baseline exertional dyspnea.  Gets short of breath walking up hill or upstairs if he goes fast, but can walk on flat ground relatively well unless he goes too far.  Occasionally has to stop.  No associated chest pain or pressure.  Has baseline edema if he is seated for too long.  Gets better with foot elevation but also with walking.  Not very active-mostly because of not much energy.  No longer on DOAC-stop because of GI bleed. Encouraged adequate hydration for dizziness.  Continue lower dose of ARB and off of HCTZ. We chose to forego further surveillance stress testing based on age.  Will only test for symptoms. Closely monitor heart rate, low threshold to reduce carvedilol dose.  Subjective  Discussed the use of AI scribe software for clinical note transcription with the patient, who gave verbal consent to proceed.  History of Present Illness   The patient, with a history of coronary artery disease, presented for a routine follow-up. He underwent bypass surgery in 1996, a stent placement in 2010, and another cardiac intervention in 2013. Since  the last intervention, the patient has been asymptomatic with no reported chest pain, pressure, or tightness. He also denies any shortness of breath during day-to-day activities, despite a sedentary lifestyle and limited physical activity, primarily consisting of housework and neighborhood walks.  The patient underwent cataract surgery in both eyes since the last visit. He also reported weakness in the thighs, attributed to lack of exercise. He has been managing his blood pressure with carvedilol and losartan, and taking hydrochlorothiazide as needed for edema.  His diabetes is controlled with Lantus insulin and Jardiance, with a recent HbA1c of 6.9. He has not needed to use nitroglycerin for over a year. The patient's cholesterol levels have slightly increased, possibly due to decreased exercise and dietary changes.  The patient reported no recent hospitalizations, no symptoms of syncope, sudden onset weakness, numbness, slurred speech, or drooling. He also denied any recent episodes of hematuria, melena, or nosebleeds.  The patient lives alone and plans to move to [place] within the next year to live with family.     Cardiovascular ROS: no chest pain or dyspnea on exertion positive for - baseline DOE from being sedentaryl; stable swelling. negative for - irregular heartbeat, orthopnea, palpitations, paroxysmal nocturnal dyspnea, rapid heart rate, shortness of breath, or lightheadedness, dizziness, syncope/near syncope, TIA/amaurosis fugax/CVA, claudication.    ROS:  Review of Systems - Negative except Symptoms and HPI are noted below. Fatigue-deconditioned and somewhat lazy.  Leg swelling, frequent nocturia.  Back and joint pain, leg weakness. Mild Ortho static dizziness Memory loss    Objective  PMH 1. Coronary artery disease involving native coronary artery of native heart without angina pectoris  -  CABG 1996 (LIMA-LAD, SVG-Diag1, SVG-OM1, SVG-RCA 2010 - BMS PCI SVG-RCA 4.0 x 18  (Unstable Angina) 2013 - PCI native Diag 2/2 CTO of SVG-Diag- 2016 (Promus DES 2.5 x 12) Echo 09/2019 - EF 60-65% no RWMA. Gr 1 DD Has noted rare episodes of non-exertional chest tightness ; not similar to pre CABG or pre PCI (angina equivalent = numbness/tingling in L arm & chest tightness. Based on previous discussions, decision was made to avoid surveillance stress testing unless symptoms warrant.  2. History of bradycardia - BUT remains on low dose Coreg  3. Primary hypertension => labile blood pressures.  4. Hyperlipidemia, =>   5. Type 2 diabetes mellitus with complication, with long-term current use of insulin (HCC)  =.  Recently started on Jardiance  6. Stage 3 chronic kidney disease, unspecified whether stage 3a or 3b CKD (HCC)   7.  ? PAF - not on DOAC. => Presumably related to recent episodes of hematochezia with lower GI bleed in December 2022 and February 2023   Studies Reviewed: Marland Kitchen   EKG Interpretation Date/Time:  Wednesday April 01 2023 08:11:26 EDT Ventricular Rate:  63 PR Interval:  164 QRS Duration:  100 QT Interval:  410 QTC Calculation: 419 R Axis:   -22  Text Interpretation: Normal sinus rhythm Normal ECG When compared with ECG of 03-Dec-2011 05:06, Questionable change in QRS axis Nonspecific T wave abnormality no longer evident in Lateral leads Confirmed by Bryan Lemma ([id number]) on 04/01/2023 8:16:40 AM    LABS from the [facility] 11/17/2022: A1c: 6.9% Total cholesterol: 149 mg/dL Triglycerides: 528 mg/dL HDL: 41 mg/dL LDL: 85 mg/dL Hemoglobin: 41.3 g/dL Platelets: 244 x 01^0/U Creatinine: 1.82 mg/dL  Monitor 12/2534 Echo 6/44/0347: EF 60 to 65%.  Mild LVH with GR 1 DD.  No RWMA.  Normal RAP and RVP.  Mild aortic valve sclerosis with no stenosis.  Normal MV. Cath-PCI July 2013  Risk Assessment/Calculations:    CHA2DS2-VASc Score = 6   This indicates a 9.7% annual risk of stroke. The patient's score is based upon: CHF History: 1 HTN History: 1 Diabetes  History: 1 Stroke History: 0 Vascular Disease History: 1 Age Score: 2 Gender Score: 0   No longer on DOAC because a history of GI bleed     Physical Exam:   VS:  BP 126/84 (BP Location: Left Arm, Patient Position: Sitting, Cuff Size: Large)   Pulse 63   Ht 5\' 10"  (1.778 m)   Wt 241 lb (109.3 kg)   SpO2 97%   BMI 34.58 kg/m    Wt Readings from Last 3 Encounters:  04/01/23 241 lb (109.3 kg)  02/19/22 236 lb 9.6 oz (107.3 kg)  08/26/21 235 lb 9.6 oz (106.9 kg)    GEN: Well nourished, well developed in no acute distress; obese NECK: No JVD; No carotid bruits CARDIAC: RRR, distant heart sounds but normal S1, S2; 1/6 SEM @ RUS-neck.  Otherwise no notable B murmurs, rubs, gallops RESPIRATORY:  Clear to auscultation without rales, wheezing or rhonchi ; nonlabored, good air movement. ABDOMEN: Soft, non-tender, non-distended EXTREMITIES:  No edema; No deformity; abnormal gait    ASSESSMENT AND PLAN: .    Problem List Items Addressed This Visit       Cardiology Problems   CABG '  96, SVG-RCA BMS 2010, DES to native Diag 12/02/11 (Chronic)    Most recent stress test was in 2013.  Has not had any anginal symptoms.  We discussed follow-up surveillance testing and based on stage, the decision was to forego further testing unless symptoms warrant.      Relevant Medications   hydrochlorothiazide (HYDRODIURIL) 25 MG tablet   Coronary artery disease involving native heart without angina pectoris - Primary (Chronic)    Stable since last intervention in 2013. No chest pain, shortness of breath, or other symptoms suggestive of angina. No recent stress test as patient remains asymptomatic. -Continue current medications including Carvedilol 12.5 mg twice daily, Aspirin 81mg , and Atorvastatin 20mg .      Relevant Medications   hydrochlorothiazide (HYDRODIURIL) 25 MG tablet   Other Relevant Orders   EKG 12-Lead (Completed)   Essential hypertension (Chronic)    Hypertension Well controlled. No  symptoms of orthostasis. -Continue Losartan 50mg  and Hydrochlorothiazide 12.5 mg as needed.      Relevant Medications   hydrochlorothiazide (HYDRODIURIL) 25 MG tablet   Other Relevant Orders   EKG 12-Lead (Completed)   Hyperlipidemia associated with type 2 diabetes mellitus (HCC) (Chronic)    Most recent lipid panel had LDL of 85 is slightly elevated compared to previous levels, possibly due to changes in diet and exercise.   He is not really all that interested in extra medications -Continue Atorvastatin 20mg . Encouraged healthier diet and increased physical activity. -> If lipids got worse, would switch to rosuvastatin 20 mg. Diabetes Mellitus Last A1C 6.9, indicating good control. -Continue Lantus insulin 21-23 units at night and Jardiance 12.5 mg.      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   Stable dose of carvedilol.       Both eyes operated on, no current issues reported. -No further action required at this time.     -Plan to move to [place] within the next year. Follow-Up:  -Will make another appointment before moving to ensure continuity of care.Return in about 1 year (around 03/31/2024) for Or earlier prior to moving to [place] for final follow-up visit.. -Scan recent lab results into patient's chart.   Total time spent: 22 min spent with patient + 16 min spent charting = 38 min    Signed, Marykay Lex, MD, MS Bryan Lemma, M.D., M.S. Interventional Cardiologist  [facility]  Pager # [phone] Phone # [phone] [address] [place], [place] [address]

## 2023-04-01 ENCOUNTER — Ambulatory Visit: Payer: Medicare Other | Attending: Nurse Practitioner | Admitting: Cardiology

## 2023-04-01 ENCOUNTER — Encounter: Payer: Self-pay | Admitting: Cardiology

## 2023-04-01 VITALS — BP 126/84 | HR 63 | Ht 70.0 in | Wt 241.0 lb

## 2023-04-01 DIAGNOSIS — E1169 Type 2 diabetes mellitus with other specified complication: Secondary | ICD-10-CM | POA: Insufficient documentation

## 2023-04-01 DIAGNOSIS — I10 Essential (primary) hypertension: Secondary | ICD-10-CM | POA: Diagnosis present

## 2023-04-01 DIAGNOSIS — E785 Hyperlipidemia, unspecified: Secondary | ICD-10-CM | POA: Diagnosis present

## 2023-04-01 DIAGNOSIS — I2510 Atherosclerotic heart disease of native coronary artery without angina pectoris: Secondary | ICD-10-CM | POA: Diagnosis not present

## 2023-04-01 DIAGNOSIS — Z9861 Coronary angioplasty status: Secondary | ICD-10-CM | POA: Diagnosis present

## 2023-04-01 DIAGNOSIS — R001 Bradycardia, unspecified: Secondary | ICD-10-CM | POA: Diagnosis present

## 2023-04-01 MED ORDER — HYDROCHLOROTHIAZIDE 25 MG PO TABS: 12.5000 mg | ORAL_TABLET | ORAL | Status: AC | PRN

## 2023-04-01 NOTE — Patient Instructions (Addendum)
Medication Instructions:   No changes   May hold aspirin if you have a procedure  coming up if needed  *If you need a refill on your cardiac medications before your next appointment, please call your pharmacy*   Lab Work:  Not needed  Testing/Procedures:  Not needed  Follow-Up: At [facility], you and your health needs are our priority.  As part of our continuing mission to provide you with exceptional heart care, we have created designated Provider Care Teams.  These Care Teams include your primary Cardiologist (physician) and Advanced Practice Providers (APPs -  Physician Assistants and Nurse Practitioners) who all work together to provide you with the care you need, when you need it.  We recommend signing up for the patient portal called "MyChart".  Sign up information is provided on this After Visit Summary.  MyChart is used to connect with patients for Virtual Visits (Telemedicine).  Patients are able to view lab/test results, encounter notes, upcoming appointments, etc.  Non-urgent messages can be sent to your provider as well.   To learn more about what you can do with MyChart, go to [email or web address].    Your next appointment:   6 to 8 month(s)  The format for your next appointment:   In Person  Provider:   Bryan Lemma, MD    Other Instructions   Keep eye on your eating habits

## 2023-04-05 ENCOUNTER — Encounter: Payer: Self-pay | Admitting: Cardiology

## 2023-04-05 NOTE — Assessment & Plan Note (Addendum)
Most recent lipid panel had LDL of 85 is slightly elevated compared to previous levels, possibly due to changes in diet and exercise.   He is not really all that interested in extra medications -Continue Atorvastatin 20mg . Encouraged healthier diet and increased physical activity. -> If lipids got worse, would switch to rosuvastatin 20 mg. Diabetes Mellitus Last A1C 6.9, indicating good control. -Continue Lantus insulin 21-23 units at night and Jardiance 12.5 mg.

## 2023-04-05 NOTE — Assessment & Plan Note (Signed)
Most recent stress test was in 2013.  Has not had any anginal symptoms.  We discussed follow-up surveillance testing and based on stage, the decision was to forego further testing unless symptoms warrant.

## 2023-04-05 NOTE — Assessment & Plan Note (Addendum)
Stable since last intervention in 2013. No chest pain, shortness of breath, or other symptoms suggestive of angina. No recent stress test as patient remains asymptomatic. -Continue current medications including Carvedilol 12.5 mg twice daily, Aspirin 81mg , and Atorvastatin 20mg .

## 2023-04-05 NOTE — Assessment & Plan Note (Signed)
Hypertension Well controlled. No symptoms of orthostasis. -Continue Losartan 50mg  and Hydrochlorothiazide 12.5 mg as needed.

## 2023-04-05 NOTE — Assessment & Plan Note (Signed)
Current heart rate 63 bpm with no signs of chronotropic incompetence.   Stable dose of carvedilol.

## 2024-05-11 NOTE — Progress Notes (Signed)
 Erroneous encounter-disregard
